# Patient Record
Sex: Female | Born: 1985 | Race: Black or African American | Hispanic: No | Marital: Single | Smoking: Never smoker
Health system: Southern US, Community
[De-identification: ages and names within clinical notes are randomized; demographics above are authoritative.]

---

## 2008-01-19 ENCOUNTER — Inpatient Hospital Stay (HOSPITAL_COMMUNITY): Admission: AD | Admit: 2008-01-19 | Discharge: 2008-01-22 | Payer: Self-pay | Admitting: Obstetrics

## 2009-12-04 ENCOUNTER — Inpatient Hospital Stay (HOSPITAL_COMMUNITY)
Admission: EM | Admit: 2009-12-04 | Discharge: 2009-12-11 | Payer: Self-pay | Source: Home / Self Care | Admitting: Family Medicine

## 2009-12-04 ENCOUNTER — Ambulatory Visit: Payer: Self-pay | Admitting: Emergency Medicine

## 2009-12-04 DIAGNOSIS — N12 Tubulo-interstitial nephritis, not specified as acute or chronic: Secondary | ICD-10-CM | POA: Insufficient documentation

## 2009-12-08 ENCOUNTER — Encounter (INDEPENDENT_AMBULATORY_CARE_PROVIDER_SITE_OTHER): Payer: Self-pay | Admitting: Internal Medicine

## 2009-12-08 ENCOUNTER — Ambulatory Visit: Payer: Self-pay | Admitting: Surgery

## 2009-12-29 ENCOUNTER — Ambulatory Visit: Payer: Self-pay | Admitting: Nurse Practitioner

## 2009-12-29 DIAGNOSIS — E119 Type 2 diabetes mellitus without complications: Secondary | ICD-10-CM

## 2009-12-29 LAB — CONVERTED CEMR LAB: Blood Glucose, Fingerstick: 371

## 2010-04-03 NOTE — Letter (Signed)
Summary: Handout Printed  Printed Handout:  - Pyelonephritis

## 2010-04-03 NOTE — Assessment & Plan Note (Signed)
Summary: NEW - Establish Care   Vital Signs:  Patient profile:   25 year old female Height:      69.75 inches Weight:      162.5 pounds BMI:     23.57 Temp:     97.3 degrees F oral Pulse rate:   89 / minute Pulse rhythm:   regular Resp:     16 per minute BP sitting:   120 / 84  (left arm) Cuff size:   regular  Vitals Entered By: Armenia Shannon (December 29, 2009 2:19 PM) CC: xf/u.... pt says she is not taking any meds right now since she is not able to afford it.... pt is not sure about her flu vaccine... Is Patient Diabetic? Yes Pain Assessment Patient in pain? no      CBG Result 371  Does patient need assistance? Functional Status Self care Ambulation Normal   CC:  xf/u.... pt says she is not taking any meds right now since she is not able to afford it.... pt is not sure about her flu vaccine....  History of Present Illness:  Pt into the office to establish care. No previous PCP Pt previously lived in [place], [place]  PMH - significant for diabetes since age 3 PMH - none  Hosptialized from 12/04/2009 to 12/11/2009 (full hospital D/C reviewed) Pt presented with a 3 days history of nausea and vomiting.  She also had abdominal pain, and fever.  Blood sugar was also noted to be over 600. Pt was admitted to step down, IV insulin, IVF.  Pt was also started on Rocephin. Tachycardia - started on lopressor (but pt has not started to take because she has not gotten the med refiled) She was dx with ARDS and dx with pneumonia.  Started on azithromycin.   Obesity - reports that she was once about 230lbs but she lost a lot of weight which helped some with her diabetes.  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 Stands for extended periods of time  Diabetes Management History:      The patient is a 25 years old female who comes in for evaluation of DM Type 2.  She has not been enrolled in the "Diabetic Education Program".  She states understanding of dietary principles and is  following her diet appropriately.  Self foot exams are not being performed.  She is not checking home blood sugars.  She says that she is not exercising regularly.        Hypoglycemic symptoms are not occurring.  No hyperglycemic symptoms are reported.  Other comments include: Pt completed her last dose of insulin last week.    Habits & Providers  Alcohol-Tobacco-Diet     Alcohol drinks/day: <1     Tobacco Status: never  Exercise-Depression-Behavior     Does Patient Exercise: no     Drug Use: never  Allergies (verified): No Known Drug Allergies  Family History: father - diabetes (deceased), MI maternal grandmother - diabetes mother - healthy maternal grandmother - healthy maternal grandfather - diabetes  Social History: 1 child (infant) tobacco - none ETOH - social  Drug - noneSmoking Status:  never Drug Use:  never Does Patient Exercise:  no  Review of Systems CV:  Denies chest pain or discomfort. Resp:  Denies cough. GI:  Denies abdominal pain, nausea, and vomiting. GU:  Denies dysuria. Endo:  Denies excessive thirst and excessive urination.  Physical Exam  General:  alert.   Head:  normocephalic.   Lungs:  normal breath sounds.   Heart:  normal rate and regular rhythm.   Abdomen:  normal bowel sounds.   Msk:  normal ROM.   Neurologic:  alert & oriented X3.     Impression & Recommendations:  Problem # 1:  DIABETES MELLITUS, TYPE II (ICD-250.00) BS elevated today but that is likely because she has been without her insulin new rx given today Her updated medication list for this problem includes:    Lantus 100 Unit/ml Soln (Insulin glargine) .Marland KitchenMarland KitchenMarland KitchenMarland Kitchen 30 units subcutaneously nightly    Novolog 100 Unit/ml Soln (Insulin aspart) .Marland Kitchen... Continue according to sliding scale  Orders: Capillary Blood Glucose/CBG (29562)  Problem # 2:  Hosp for PYELONEPHRITIS (ICD-590.80) handout given to pt about dx  Problem # 3:  NEED PROPHYLACTIC VACCINATION&INOCULATION FLU  (ICD-V04.81) given today in office  Complete Medication List: 1)  Lantus 100 Unit/ml Soln (Insulin glargine) .... 30 units subcutaneously nightly 2)  Novolog 100 Unit/ml Soln (Insulin aspart) .... Continue according to sliding scale  Other Orders: Flu Vaccine 73yrs + (13086) Admin 1st Vaccine (57846)  Patient Instructions: 1)  Get an appointment for eligibility to get an orange card to continue to be seen in this office. 2)  You have been given the flu vaccine today. 3)  You can get insulin from the [facility] pharmacy 4)  Follow up with n.martin,fnp for diabetes in 4 weeks for diabetes. 5)  Will need u/a, microalbumin, pneumovax, monofilament. 6)  will need glucometer  Prescriptions: NOVOLOG 100 UNIT/ML SOLN (INSULIN ASPART) Continue according to sliding scale  #1 x 1   Entered and Authorized by:   Lehman Prom FNP   Signed by:   Lehman Prom FNP on 12/29/2009   Method used:   Print then Give to Patient   RxID:   [id number] LANTUS 100 UNIT/ML SOLN (INSULIN GLARGINE) 30 units subcutaneously nightly  #1 month qs x 1   Entered and Authorized by:   Lehman Prom FNP   Signed by:   Lehman Prom FNP on 12/29/2009   Method used:   Print then Give to Patient   RxID:   [id number]    Orders Added: 1)  New Patient Level III [95638] 2)  Capillary Blood Glucose/CBG [82948] 3)  Flu Vaccine 58yrs + [75643] 4)  Admin 1st Vaccine [32951]   Immunizations Administered:  Influenza Vaccine # 1:    Vaccine Type: Fluvax 3+    Site: right deltoid    Mfr: GlaxoSmithKline    Dose: 0.5 ml    Route: IM    Given by: Armenia Shannon    Exp. Date: 09/01/2010    Lot #: [id number]    VIS given: 09/26/09 version given December 29, 2009.  Flu Vaccine Consent Questions:    Do you have a history of severe allergic reactions to this vaccine? no    Any prior history of allergic reactions to egg and/or gelatin? no    Do you have a sensitivity to the preservative Thimersol? no    Do  you have a past history of Guillan-Barre Syndrome? no    Do you currently have an acute febrile illness? no    Have you ever had a severe reaction to latex? no    Vaccine information given and explained to patient? yes    Are you currently pregnant? no   Immunizations Administered:  Influenza Vaccine # 1:    Vaccine Type: Fluvax 3+    Site: right deltoid    Mfr: GlaxoSmithKline    Dose:  0.5 ml    Route: IM    Given by: Armenia Shannon    Exp. Date: 09/01/2010    Lot #: [id number]    VIS given: 09/26/09 version given December 29, 2009.   CXR  Procedure date:  12/04/2009  Findings:      mild peribronchial thickening without focal airspace disease  CT of Abdomen  Procedure date:  12/04/2009  Findings:      low attenenuation lesion in both kidneys with mild perinephric stranding. The finding was consistent with bilateral pyelonephritis  X-ray  Procedure date:  12/04/2009  Findings:      abdomen - negative  MISC. Report  Procedure date:  12/04/2009  Findings:      CT angiogram - no evidence of pulmonary embolism.  Extensive airspace infiltrate bilaterally in the lower lobe and in the left upper lobe compatible with pneumonia   CXR  Procedure date:  12/04/2009  Findings:      mild peribronchial thickening without focal airspace disease  CT of Abdomen  Procedure date:  12/04/2009  Findings:      low attenenuation lesion in both kidneys with mild perinephric stranding. The finding was consistent with bilateral pyelonephritis  X-ray  Procedure date:  12/04/2009  Findings:      abdomen - negative  MISC. Report  Procedure date:  12/04/2009  Findings:      CT angiogram - no evidence of pulmonary embolism.  Extensive airspace infiltrate bilaterally in the lower lobe and in the left upper lobe compatible with pneumonia

## 2010-04-03 NOTE — Letter (Signed)
Summary: PT INFORMATION SHEET  PT INFORMATION SHEET   Imported By: Arta Bruce 01/18/2010 15:39:10  _____________________________________________________________________  External Attachment:    Type:   Image     Comment:   External Document

## 2010-05-17 LAB — GLUCOSE, CAPILLARY
Glucose-Capillary: 121 mg/dL — ABNORMAL HIGH (ref 70–99)
Glucose-Capillary: 125 mg/dL — ABNORMAL HIGH (ref 70–99)
Glucose-Capillary: 130 mg/dL — ABNORMAL HIGH (ref 70–99)
Glucose-Capillary: 137 mg/dL — ABNORMAL HIGH (ref 70–99)
Glucose-Capillary: 143 mg/dL — ABNORMAL HIGH (ref 70–99)
Glucose-Capillary: 149 mg/dL — ABNORMAL HIGH (ref 70–99)
Glucose-Capillary: 152 mg/dL — ABNORMAL HIGH (ref 70–99)
Glucose-Capillary: 152 mg/dL — ABNORMAL HIGH (ref 70–99)
Glucose-Capillary: 153 mg/dL — ABNORMAL HIGH (ref 70–99)
Glucose-Capillary: 154 mg/dL — ABNORMAL HIGH (ref 70–99)
Glucose-Capillary: 154 mg/dL — ABNORMAL HIGH (ref 70–99)
Glucose-Capillary: 154 mg/dL — ABNORMAL HIGH (ref 70–99)
Glucose-Capillary: 156 mg/dL — ABNORMAL HIGH (ref 70–99)
Glucose-Capillary: 159 mg/dL — ABNORMAL HIGH (ref 70–99)
Glucose-Capillary: 167 mg/dL — ABNORMAL HIGH (ref 70–99)
Glucose-Capillary: 168 mg/dL — ABNORMAL HIGH (ref 70–99)
Glucose-Capillary: 171 mg/dL — ABNORMAL HIGH (ref 70–99)
Glucose-Capillary: 176 mg/dL — ABNORMAL HIGH (ref 70–99)
Glucose-Capillary: 180 mg/dL — ABNORMAL HIGH (ref 70–99)
Glucose-Capillary: 181 mg/dL — ABNORMAL HIGH (ref 70–99)
Glucose-Capillary: 192 mg/dL — ABNORMAL HIGH (ref 70–99)
Glucose-Capillary: 200 mg/dL — ABNORMAL HIGH (ref 70–99)
Glucose-Capillary: 215 mg/dL — ABNORMAL HIGH (ref 70–99)
Glucose-Capillary: 216 mg/dL — ABNORMAL HIGH (ref 70–99)
Glucose-Capillary: 231 mg/dL — ABNORMAL HIGH (ref 70–99)
Glucose-Capillary: 232 mg/dL — ABNORMAL HIGH (ref 70–99)
Glucose-Capillary: 334 mg/dL — ABNORMAL HIGH (ref 70–99)
Glucose-Capillary: 343 mg/dL — ABNORMAL HIGH (ref 70–99)
Glucose-Capillary: 82 mg/dL (ref 70–99)
Glucose-Capillary: 98 mg/dL (ref 70–99)
Glucose-Capillary: 99 mg/dL (ref 70–99)
Glucose-Capillary: >600 mg/dL (ref 70–99)

## 2010-05-17 LAB — COMPREHENSIVE METABOLIC PANEL
ALT: 12 U/L (ref 0–35)
ALT: <8 U/L (ref 0–35)
ALT: <8 U/L (ref 0–35)
AST: 15 U/L (ref 0–37)
AST: 20 U/L (ref 0–37)
AST: 23 U/L (ref 0–37)
Alkaline Phosphatase: 93 U/L (ref 39–117)
BUN: 1 mg/dL — ABNORMAL LOW (ref 6–23)
BUN: 3 mg/dL — ABNORMAL LOW (ref 6–23)
BUN: 8 mg/dL (ref 6–23)
CO2: 22 mEq/L (ref 19–32)
CO2: 23 mEq/L (ref 19–32)
CO2: 27 mEq/L (ref 19–32)
CO2: 30 mEq/L (ref 19–32)
CO2: 31 mEq/L (ref 19–32)
Calcium: 7.8 mg/dL — ABNORMAL LOW (ref 8.4–10.5)
Calcium: 8.1 mg/dL — ABNORMAL LOW (ref 8.4–10.5)
Calcium: 8.6 mg/dL (ref 8.4–10.5)
Chloride: 105 mEq/L (ref 96–112)
Chloride: 110 mEq/L (ref 96–112)
Chloride: 97 mEq/L (ref 96–112)
Creatinine, Ser: 0.46 mg/dL (ref 0.4–1.2)
Creatinine, Ser: 0.51 mg/dL (ref 0.4–1.2)
Creatinine, Ser: 0.52 mg/dL (ref 0.4–1.2)
Creatinine, Ser: 0.54 mg/dL (ref 0.4–1.2)
Creatinine, Ser: 0.83 mg/dL (ref 0.4–1.2)
GFR calc Af Amer: >60 mL/min (ref >60)
GFR calc Af Amer: >60 mL/min (ref >60)
GFR calc non Af Amer: >60 mL/min (ref >60)
GFR calc non Af Amer: >60 mL/min (ref >60)
GFR calc non Af Amer: >60 mL/min (ref >60)
GFR calc non Af Amer: >60 mL/min (ref >60)
GFR calc non Af Amer: >60 mL/min (ref >60)
GFR calc non Af Amer: >60 mL/min (ref >60)
Glucose, Bld: 159 mg/dL — ABNORMAL HIGH (ref 70–99)
Glucose, Bld: 188 mg/dL — ABNORMAL HIGH (ref 70–99)
Glucose, Bld: 509 mg/dL — ABNORMAL HIGH (ref 70–99)
Glucose, Bld: 88 mg/dL (ref 70–99)
Potassium: 3.3 mEq/L — ABNORMAL LOW (ref 3.5–5.1)
Sodium: 136 mEq/L (ref 135–145)
Sodium: 138 mEq/L (ref 135–145)
Sodium: 138 mEq/L (ref 135–145)
Total Bilirubin: 0.4 mg/dL (ref 0.3–1.2)
Total Bilirubin: 0.9 mg/dL (ref 0.3–1.2)
Total Bilirubin: 1.2 mg/dL (ref 0.3–1.2)
Total Protein: 5.9 g/dL — ABNORMAL LOW (ref 6.0–8.3)

## 2010-05-17 LAB — BLOOD GAS, ARTERIAL
Acid-Base Excess: 0.2 mmol/L (ref 0.0–2.0)
Bicarbonate: 23.7 mEq/L (ref 20.0–24.0)
O2 Saturation: 96.8 %
Patient temperature: 99.5
TCO2: 21.8 mmol/L (ref 0–100)
pO2, Arterial: 82 mmHg (ref 80.0–100.0)

## 2010-05-17 LAB — DIFFERENTIAL
Basophils Absolute: 0 10*3/uL (ref 0.0–0.1)
Basophils Absolute: 0 10*3/uL (ref 0.0–0.1)
Basophils Absolute: 0 10*3/uL (ref 0.0–0.1)
Basophils Absolute: 0 10*3/uL (ref 0.0–0.1)
Basophils Relative: 0 % (ref 0–1)
Basophils Relative: 0 % (ref 0–1)
Eosinophils Absolute: 0 10*3/uL (ref 0.0–0.7)
Eosinophils Absolute: 0.2 10*3/uL (ref 0.0–0.7)
Eosinophils Absolute: 0.2 10*3/uL (ref 0.0–0.7)
Eosinophils Relative: 0 % (ref 0–5)
Eosinophils Relative: 1 % (ref 0–5)
Eosinophils Relative: 2 % (ref 0–5)
Eosinophils Relative: 2 % (ref 0–5)
Lymphocytes Relative: 13 % (ref 12–46)
Lymphocytes Relative: 22 % (ref 12–46)
Lymphocytes Relative: 23 % (ref 12–46)
Lymphocytes Relative: 5 % — ABNORMAL LOW (ref 12–46)
Lymphocytes Relative: 8 % — ABNORMAL LOW (ref 12–46)
Lymphs Abs: 1.2 K/uL (ref 0.7–4.0)
Lymphs Abs: 2.1 10*3/uL (ref 0.7–4.0)
Lymphs Abs: 2.5 10*3/uL (ref 0.7–4.0)
Lymphs Abs: 2.6 10*3/uL (ref 0.7–4.0)
Monocytes Absolute: 1.2 10*3/uL — ABNORMAL HIGH (ref 0.1–1.0)
Monocytes Relative: 4 % (ref 3–12)
Monocytes Relative: 4 % (ref 3–12)
Monocytes Relative: 5 % (ref 3–12)
Monocytes Relative: 7 % (ref 3–12)
Neutro Abs: 12.3 10*3/uL — ABNORMAL HIGH (ref 1.7–7.7)
Neutro Abs: 20.8 K/uL — ABNORMAL HIGH (ref 1.7–7.7)
Neutro Abs: 7.9 10*3/uL — ABNORMAL HIGH (ref 1.7–7.7)
Neutrophils Relative %: 70 % (ref 43–77)
Neutrophils Relative %: 79 % — ABNORMAL HIGH (ref 43–77)
Neutrophils Relative %: 84 % — ABNORMAL HIGH (ref 43–77)
Neutrophils Relative %: 88 % — ABNORMAL HIGH (ref 43–77)
Neutrophils Relative %: 90 % — ABNORMAL HIGH (ref 43–77)

## 2010-05-17 LAB — CBC
HCT: 27.4 % — ABNORMAL LOW (ref 36.0–46.0)
HCT: 27.7 % — ABNORMAL LOW (ref 36.0–46.0)
HCT: 28.5 % — ABNORMAL LOW (ref 36.0–46.0)
HCT: 29.3 % — ABNORMAL LOW (ref 36.0–46.0)
HCT: 30.4 % — ABNORMAL LOW (ref 36.0–46.0)
HCT: 37.1 % (ref 36.0–46.0)
Hemoglobin: 10.3 g/dL — ABNORMAL LOW (ref 12.0–15.0)
Hemoglobin: 12.7 g/dL (ref 12.0–15.0)
Hemoglobin: 9.2 g/dL — ABNORMAL LOW (ref 12.0–15.0)
Hemoglobin: 9.3 g/dL — ABNORMAL LOW (ref 12.0–15.0)
Hemoglobin: 9.3 g/dL — ABNORMAL LOW (ref 12.0–15.0)
MCH: 27.8 pg (ref 26.0–34.0)
MCH: 27.8 pg (ref 26.0–34.0)
MCH: 28.1 pg (ref 26.0–34.0)
MCH: 28.4 pg (ref 26.0–34.0)
MCH: 28.6 pg (ref 26.0–34.0)
MCHC: 33.2 g/dL (ref 30.0–36.0)
MCHC: 33.3 g/dL (ref 30.0–36.0)
MCHC: 33.5 g/dL (ref 30.0–36.0)
MCHC: 33.7 g/dL (ref 30.0–36.0)
MCHC: 34.2 g/dL (ref 30.0–36.0)
MCV: 83.6 fL (ref 78.0–100.0)
MCV: 83.8 fL (ref 78.0–100.0)
Platelets: 209 10*3/uL (ref 150–400)
Platelets: 246 K/uL (ref 150–400)
Platelets: 354 10*3/uL (ref 150–400)
Platelets: 414 10*3/uL — ABNORMAL HIGH (ref 150–400)
RBC: 3.28 MIL/uL — ABNORMAL LOW (ref 3.87–5.11)
RBC: 3.3 MIL/uL — ABNORMAL LOW (ref 3.87–5.11)
RBC: 3.34 MIL/uL — ABNORMAL LOW (ref 3.87–5.11)
RBC: 3.6 MIL/uL — ABNORMAL LOW (ref 3.87–5.11)
RBC: 4.43 MIL/uL (ref 3.87–5.11)
RDW: 14.4 % (ref 11.5–15.5)
RDW: 14.4 % (ref 11.5–15.5)
RDW: 14.6 % (ref 11.5–15.5)
WBC: 13.1 10*3/uL — ABNORMAL HIGH (ref 4.0–10.5)
WBC: 18 10*3/uL — ABNORMAL HIGH (ref 4.0–10.5)
WBC: 23.1 10*3/uL — ABNORMAL HIGH (ref 4.0–10.5)
WBC: 23.2 K/uL — ABNORMAL HIGH (ref 4.0–10.5)

## 2010-05-17 LAB — COMPREHENSIVE METABOLIC PANEL WITH GFR
ALT: 8 U/L (ref 0–35)
AST: 17 U/L (ref 0–37)
Albumin: 3.6 g/dL (ref 3.5–5.2)
Alkaline Phosphatase: 119 U/L — ABNORMAL HIGH (ref 39–117)
Calcium: 8.4 mg/dL (ref 8.4–10.5)
GFR calc Af Amer: >60 mL/min (ref >60)
Potassium: 3.7 meq/L (ref 3.5–5.1)
Sodium: 134 meq/L — ABNORMAL LOW (ref 135–145)
Total Protein: 8 g/dL (ref 6.0–8.3)

## 2010-05-17 LAB — URINE MICROSCOPIC-ADD ON

## 2010-05-17 LAB — CULTURE, BLOOD (ROUTINE X 2)
Culture  Setup Time: 201110032128
Culture  Setup Time: 201110032130
Culture: NO GROWTH
Culture: NO GROWTH

## 2010-05-17 LAB — BASIC METABOLIC PANEL
BUN: 6 mg/dL (ref 6–23)
CO2: 26 mEq/L (ref 19–32)
CO2: 29 mEq/L (ref 19–32)
Calcium: 7.6 mg/dL — ABNORMAL LOW (ref 8.4–10.5)
Calcium: 8.7 mg/dL (ref 8.4–10.5)
Chloride: 108 mEq/L (ref 96–112)
Creatinine, Ser: 0.48 mg/dL (ref 0.4–1.2)
Creatinine, Ser: 0.62 mg/dL (ref 0.4–1.2)
Creatinine, Ser: 0.66 mg/dL (ref 0.4–1.2)
GFR calc Af Amer: >60 mL/min (ref >60)
GFR calc Af Amer: >60 mL/min (ref >60)
GFR calc non Af Amer: >60 mL/min (ref >60)
GFR calc non Af Amer: >60 mL/min (ref >60)
GFR calc non Af Amer: >60 mL/min (ref >60)
GFR calc non Af Amer: >60 mL/min (ref >60)
Glucose, Bld: 170 mg/dL — ABNORMAL HIGH (ref 70–99)
Potassium: 3.8 mEq/L (ref 3.5–5.1)
Potassium: 4 mEq/L (ref 3.5–5.1)
Sodium: 137 mEq/L (ref 135–145)
Sodium: 140 mEq/L (ref 135–145)

## 2010-05-17 LAB — MAGNESIUM
Magnesium: 1.2 mg/dL — ABNORMAL LOW (ref 1.5–2.5)
Magnesium: 1.6 mg/dL (ref 1.5–2.5)
Magnesium: 1.7 mg/dL (ref 1.5–2.5)
Magnesium: 1.7 mg/dL (ref 1.5–2.5)
Magnesium: 1.7 mg/dL (ref 1.5–2.5)

## 2010-05-17 LAB — BRAIN NATRIURETIC PEPTIDE: Pro B Natriuretic peptide (BNP): 81.3 pg/mL (ref 0.0–100.0)

## 2010-05-17 LAB — URINALYSIS, ROUTINE W REFLEX MICROSCOPIC
Bilirubin Urine: NEGATIVE
Glucose, UA: >1000 mg/dL — AB
Ketones, ur: NEGATIVE mg/dL
Nitrite: NEGATIVE
Protein, ur: NEGATIVE mg/dL
Specific Gravity, Urine: 1.023 (ref 1.005–1.030)
Urobilinogen, UA: 1 mg/dL (ref 0.0–1.0)
pH: 6 (ref 5.0–8.0)

## 2010-05-17 LAB — POCT I-STAT, CHEM 8
Calcium, Ion: 0.93 mmol/L — ABNORMAL LOW (ref 1.12–1.32)
Chloride: 98 mEq/L (ref 96–112)
HCT: 44 % (ref 36.0–46.0)
Potassium: 3.7 mEq/L (ref 3.5–5.1)
Sodium: 133 mEq/L — ABNORMAL LOW (ref 135–145)

## 2010-05-17 LAB — URINE CULTURE: Colony Count: >=100000

## 2010-05-17 LAB — HEMOGLOBIN A1C: Mean Plasma Glucose: 315 mg/dL — ABNORMAL HIGH (ref <117)

## 2010-05-17 LAB — PREGNANCY, URINE: Preg Test, Ur: NEGATIVE

## 2010-05-17 LAB — HEPATIC FUNCTION PANEL
Albumin: 2 g/dL — ABNORMAL LOW (ref 3.5–5.2)
Total Protein: 5.2 g/dL — ABNORMAL LOW (ref 6.0–8.3)

## 2010-05-17 LAB — SEDIMENTATION RATE: Sed Rate: 105 mm/hr — ABNORMAL HIGH (ref 0–22)

## 2010-05-17 LAB — PHOSPHORUS: Phosphorus: 2.2 mg/dL — ABNORMAL LOW (ref 2.3–4.6)

## 2010-11-26 ENCOUNTER — Emergency Department (HOSPITAL_COMMUNITY)
Admission: EM | Admit: 2010-11-26 | Discharge: 2010-11-26 | Disposition: A | Discharge status: Home / Self Care | Payer: Self-pay | Attending: Emergency Medicine | Admitting: Emergency Medicine

## 2010-11-26 DIAGNOSIS — E119 Type 2 diabetes mellitus without complications: Secondary | ICD-10-CM | POA: Insufficient documentation

## 2010-11-26 DIAGNOSIS — K089 Disorder of teeth and supporting structures, unspecified: Secondary | ICD-10-CM | POA: Insufficient documentation

## 2010-11-26 LAB — BASIC METABOLIC PANEL
CO2: 28 mEq/L (ref 19–32)
Calcium: 9.1 mg/dL (ref 8.4–10.5)
Chloride: 97 mEq/L (ref 96–112)
Glucose, Bld: 476 mg/dL — ABNORMAL HIGH (ref 70–99)
Sodium: 133 mEq/L — ABNORMAL LOW (ref 135–145)

## 2010-11-26 LAB — GLUCOSE, CAPILLARY: Glucose-Capillary: 279 mg/dL — ABNORMAL HIGH (ref 70–99)

## 2010-12-05 LAB — CBC
HCT: 28.7 — ABNORMAL LOW
HCT: 30.3 — ABNORMAL LOW
Hemoglobin: 9.5 — ABNORMAL LOW
MCHC: 33.2
MCHC: 33.2
MCV: 86.7
Platelets: 198
Platelets: 217
RBC: 3.32 — ABNORMAL LOW
RBC: 3.49 — ABNORMAL LOW
WBC: 12.1 — ABNORMAL HIGH
WBC: 14.1 — ABNORMAL HIGH

## 2010-12-05 LAB — URIC ACID: Uric Acid, Serum: 4.4

## 2010-12-05 LAB — COMPREHENSIVE METABOLIC PANEL
BUN: 4 — ABNORMAL LOW
CO2: 24
Chloride: 98
Creatinine, Ser: 0.48
GFR calc non Af Amer: >60
Total Bilirubin: 0.5

## 2011-01-08 ENCOUNTER — Encounter: Payer: Self-pay | Admitting: Internal Medicine

## 2011-05-16 ENCOUNTER — Encounter (HOSPITAL_COMMUNITY): Payer: Self-pay

## 2011-05-16 ENCOUNTER — Emergency Department (HOSPITAL_COMMUNITY)
Admission: EM | Admit: 2011-05-16 | Discharge: 2011-05-16 | Disposition: A | Discharge status: Home / Self Care | Payer: Medicaid Other | Attending: Emergency Medicine | Admitting: Emergency Medicine

## 2011-05-16 DIAGNOSIS — R5381 Other malaise: Secondary | ICD-10-CM | POA: Insufficient documentation

## 2011-05-16 DIAGNOSIS — E109 Type 1 diabetes mellitus without complications: Secondary | ICD-10-CM | POA: Insufficient documentation

## 2011-05-16 LAB — URINALYSIS, ROUTINE W REFLEX MICROSCOPIC
Leukocytes, UA: NEGATIVE
Nitrite: NEGATIVE
Specific Gravity, Urine: 1.034 — ABNORMAL HIGH (ref 1.005–1.030)
Urobilinogen, UA: 0.2 mg/dL (ref 0.0–1.0)

## 2011-05-16 LAB — BASIC METABOLIC PANEL
BUN: 7 mg/dL (ref 6–23)
GFR calc Af Amer: >90 mL/min (ref >90)
GFR calc non Af Amer: >90 mL/min (ref >90)
Potassium: 3.9 mEq/L (ref 3.5–5.1)
Sodium: 129 mEq/L — ABNORMAL LOW (ref 135–145)

## 2011-05-16 LAB — CBC
Hemoglobin: 12.4 g/dL (ref 12.0–15.0)
MCHC: 34.1 g/dL (ref 30.0–36.0)
RBC: 4.62 MIL/uL (ref 3.87–5.11)

## 2011-05-16 LAB — PREGNANCY, URINE: Preg Test, Ur: NEGATIVE

## 2011-05-16 LAB — URINE MICROSCOPIC-ADD ON

## 2011-05-16 LAB — GLUCOSE, CAPILLARY: Glucose-Capillary: 465 mg/dL — ABNORMAL HIGH (ref 70–99)

## 2011-05-16 MED ORDER — SODIUM CHLORIDE 0.9 % IV BOLUS (SEPSIS)
1000.0000 mL | Freq: Once | INTRAVENOUS | Status: AC
  Administered 2011-05-16: 1000 mL via INTRAVENOUS

## 2011-05-16 MED ORDER — FREESTYLE SYSTEM KIT: 1.0000 | PACK | Status: DC | PRN

## 2011-05-16 MED ORDER — INSULIN REGULAR HUMAN 100 UNIT/ML IJ SOLN: 10.0000 [IU] | Freq: Once | INTRAMUSCULAR | Status: DC

## 2011-05-16 MED ORDER — INSULIN REGULAR HUMAN 100 UNIT/ML IJ SOLN: 2.0000 [IU] | Freq: Once | INTRAMUSCULAR | Status: DC

## 2011-05-16 MED ORDER — INSULIN ASPART 100 UNIT/ML ~~LOC~~ SOLN
10.0000 [IU] | Freq: Once | SUBCUTANEOUS | Status: AC
  Administered 2011-05-16: 10 [IU] via INTRAVENOUS
  Filled 2011-05-16: qty 1

## 2011-05-16 MED ORDER — FREESTYLE SYSTEM KIT: 1.0000 | PACK | Status: AC | PRN

## 2011-05-16 MED ORDER — INSULIN ASPART 100 UNIT/ML ~~LOC~~ SOLN: 2.0000 [IU] | Freq: Once | SUBCUTANEOUS | Status: DC

## 2011-05-16 NOTE — Progress Notes (Signed)
Pt confirms change in pcp from [facility] to [facility] Entered in [place]

## 2011-05-16 NOTE — ED Notes (Signed)
Accepted patient from offgoing nurse found her alert and oriented skin and drry No CO pain noted

## 2011-05-16 NOTE — ED Provider Notes (Signed)
History     CSN: [id number]  Arrival date & time 05/16/11  1630   First MD Initiated Contact with Patient 05/16/11 1707      Chief Complaint  Patient presents with  . Hyperglycemia    (Consider location/radiation/quality/duration/timing/severity/associated sxs/prior treatment) HPI Comments: Patient here after referral from PCP - she reports that she just got insurance back and had not been taking her insulin for over a year - states usually had taken regular on a sliding scale - states she went to her PCP today because she was feeling weak - admits to polydipsia, polyphagia and weakness, no blurred vision, chest pain, shortness of breath, abdominal pain, nausea or vomiting.  No prior history of DKA  Patient is a 26 y.o. female presenting with diabetes problem. The history is provided by the patient. No language interpreter was used.  Diabetes She presents for her follow-up diabetic visit. She has type 1 diabetes mellitus. No MedicAlert identification noted. The initial diagnosis of diabetes was made 10 years ago. Her disease course has been fluctuating. There are no hypoglycemic associated symptoms. Associated symptoms include fatigue, polydipsia, polyphagia, polyuria and weakness. Pertinent negatives for diabetes include no blurred vision, no chest pain, no foot paresthesias, no foot ulcerations, no visual change and no weight loss. There are no hypoglycemic complications. Symptoms are worsening. Pertinent negatives for diabetic complications include no autonomic neuropathy, CVA, heart disease, impotence, nephropathy, peripheral neuropathy, PVD or retinopathy. Risk factors for coronary artery disease include no known risk factors. When asked about current treatments, none were reported. She is compliant with treatment none of the time. She is following a generally unhealthy diet. When asked about meal planning, she reported none. She has not had a previous visit with a dietician. She participates  in exercise three times a week. An ACE inhibitor/angiotensin II receptor blocker is not being taken. She does not see a podiatrist.Eye exam is not current.    Past Medical History  Diagnosis Date  . Diabetes mellitus     No past surgical history on file.  No family history on file.  History  Substance Use Topics  . Smoking status: Not on file  . Smokeless tobacco: Not on file  . Alcohol Use: Yes    OB History    Grav Para Term Preterm Abortions TAB SAB Ect Mult Living                  Review of Systems  Constitutional: Positive for fatigue. Negative for weight loss.  Eyes: Negative for blurred vision.  Cardiovascular: Negative for chest pain.  Genitourinary: Positive for polyuria. Negative for impotence.  Neurological: Positive for weakness.  Hematological: Positive for polydipsia and polyphagia.  All other systems reviewed and are negative.    Allergies  Review of patient's allergies indicates no known allergies.  Home Medications   Current Outpatient Rx  Name Route Sig Dispense Refill  . IBUPROFEN 200 MG PO TABS Oral Take 400 mg by mouth every 6 (six) hours as needed. Pain.      BP 112/77  Pulse 85  Temp(Src) 98.5 F (36.9 C) (Oral)  Resp 16  SpO2 100%  LMP 05/15/2011  Physical Exam  Nursing note and vitals reviewed. Constitutional: She is oriented to person, place, and time. She appears well-developed and well-nourished. No distress.  HENT:  Head: Normocephalic and atraumatic.  Right Ear: External ear normal.  Left Ear: External ear normal.  Nose: Nose normal.  Mouth/Throat: Oropharynx is clear and moist. No oropharyngeal  exudate.  Eyes: Conjunctivae are normal. Pupils are equal, round, and reactive to light. No scleral icterus.  Neck: Normal range of motion. Neck supple.  Cardiovascular: Normal rate, regular rhythm and normal heart sounds.  Exam reveals no gallop and no friction rub.   No murmur heard. Pulmonary/Chest: Effort normal and breath  sounds normal. No respiratory distress. She exhibits no tenderness.  Abdominal: Soft. Bowel sounds are normal. She exhibits no distension. There is no tenderness.  Musculoskeletal: Normal range of motion. She exhibits no edema and no tenderness.  Lymphadenopathy:    She has no cervical adenopathy.  Neurological: She is alert and oriented to person, place, and time. No cranial nerve deficit.  Skin: Skin is warm and dry. No rash noted. No erythema. No pallor.  Psychiatric: She has a normal mood and affect. Her behavior is normal. Judgment and thought content normal.    ED Course  Procedures (including critical care time)  Labs Reviewed  URINALYSIS, ROUTINE W REFLEX MICROSCOPIC - Abnormal; Notable for the following:    Specific Gravity, Urine 1.034 (*)    Glucose, UA >1000 (*)    Hgb urine dipstick LARGE (*)    All other components within normal limits  BASIC METABOLIC PANEL - Abnormal; Notable for the following:    Sodium 129 (*)    Chloride 92 (*)    Glucose, Bld 511 (*)    Creatinine, Ser 0.46 (*)    All other components within normal limits  GLUCOSE, CAPILLARY - Abnormal; Notable for the following:    Glucose-Capillary 465 (*)    All other components within normal limits  PREGNANCY, URINE  CBC  URINE MICROSCOPIC-ADD ON   No results found.  Results for orders placed during the hospital encounter of 05/16/11  PREGNANCY, URINE      Component Value Range   Preg Test, Ur NEGATIVE  NEGATIVE   URINALYSIS, ROUTINE W REFLEX MICROSCOPIC      Component Value Range   Color, Urine YELLOW  YELLOW    APPearance CLEAR  CLEAR    Specific Gravity, Urine 1.034 (*) 1.005 - 1.030    pH 6.0  5.0 - 8.0    Glucose, UA >1000 (*) NEGATIVE (mg/dL)   Hgb urine dipstick LARGE (*) NEGATIVE    Bilirubin Urine NEGATIVE  NEGATIVE    Ketones, ur NEGATIVE  NEGATIVE (mg/dL)   Protein, ur NEGATIVE  NEGATIVE (mg/dL)   Urobilinogen, UA 0.2  0.0 - 1.0 (mg/dL)   Nitrite NEGATIVE  NEGATIVE    Leukocytes,  UA NEGATIVE  NEGATIVE   BASIC METABOLIC PANEL      Component Value Range   Sodium 129 (*) 135 - 145 (mEq/L)   Potassium 3.9  3.5 - 5.1 (mEq/L)   Chloride 92 (*) 96 - 112 (mEq/L)   CO2 28  19 - 32 (mEq/L)   Glucose, Bld 511 (*) 70 - 99 (mg/dL)   BUN 7  6 - 23 (mg/dL)   Creatinine, Ser 8.29 (*) 0.50 - 1.10 (mg/dL)   Calcium 9.5  8.4 - 56.2 (mg/dL)   GFR calc non Af Amer >90  >90 (mL/min)   GFR calc Af Amer >90  >90 (mL/min)  CBC      Component Value Range   WBC 9.6  4.0 - 10.5 (K/uL)   RBC 4.62  3.87 - 5.11 (MIL/uL)   Hemoglobin 12.4  12.0 - 15.0 (g/dL)   HCT 13.0  86.5 - 78.4 (%)   MCV 78.8  78.0 - 100.0 (fL)  MCH 26.8  26.0 - 34.0 (pg)   MCHC 34.1  30.0 - 36.0 (g/dL)   RDW 04.5  40.9 - 81.1 (%)   Platelets 291  150 - 400 (K/uL)  GLUCOSE, CAPILLARY      Component Value Range   Glucose-Capillary 465 (*) 70 - 99 (mg/dL)  URINE MICROSCOPIC-ADD ON      Component Value Range   Squamous Epithelial / LPF RARE  RARE    WBC, UA 0-2  <3 (WBC/hpf)   RBC / HPF 0-2  <3 (RBC/hpf)   Bacteria, UA RARE  RARE   GLUCOSE, CAPILLARY      Component Value Range   Glucose-Capillary 367 (*) 70 - 99 (mg/dL)   Comment 1 Notify RN    GLUCOSE, CAPILLARY      Component Value Range   Glucose-Capillary 275 (*) 70 - 99 (mg/dL)   No results found.   Hyperglycemia without DKA    MDM  Patient is not acidotic - plan to get blood sugar down below 250 and then send  Home on regular insulin via sliding scale and she can follow up with PCP - she also has no glucometer.    Patient will be discharged, was quite labile with blood sugar here and I do not feel we need to dose the lantus and will allow Dr. Bruna Potter to do so.  I will write for sliding scale for the next several days along with glucometer and test strips.  Patient is in agreement with this plan and we will discharge her home.    Izola Price [place], [place] 05/16/11 2051

## 2011-05-16 NOTE — ED Notes (Signed)
Pt states hasn't checked her sugar or taken any of her DM meds for over a year. States went to PCP d/t feeling weak.

## 2011-05-16 NOTE — Discharge Instructions (Signed)
Correction Insulin Your caregiver has decided you need insulin at home. You have been given a correctional scale (sliding scale) in case you need extra insulin when your blood sugar is too high (hyperglycemia). The following instructions will assist you in how to use that correctional scale.  WHAT IS A CORRECTIONAL SCALE (SLIDING SCALE)?  When you check your blood sugar, sometimes it will be higher than your caregiver wants it to be. You may need an extra dose of insulin to bring your blood sugar to your desired level (also known as your goal, target level, or normal level.) The correctional scale is prescribed by your caregiver based on your specific needs. Your correctional scale has 2 parts. The first shows you a blood sugar range. The second part tells you how much extra insulin to give yourself if your blood sugar falls within this range. You will not need an extra dose of insulin if your blood glucose is in the desired range. You should always give yourself the normal amount of insulin your caregiver ordered as well.  The most common time to check your blood sugar is before eating and at bedtime. Check with your caregiver so he or she can tell you what the best times are for you.  WHY IS IT IMPORTANT TO KEEP YOUR BLOOD SUGAR LEVELS AT YOUR DESIRED LEVEL?  It helps to prevent long-term complications of diabetes, such as eye disease, kidney failure, and other serious complications. WHAT TYPE OF INSULIN WILL YOU USE?  To help bring down blood sugars that are too high, your caregiver has prescribed a short-acting or a rapid-acting insulin. An example of a short-acting insulin would be Regular. Remember, you may also have a longer-acting insulin.  WHAT DO I NEED TO DO?   Check your blood sugar with your home blood glucose meter as recommended by your caregiver.   Using your correctional scale, find the range your blood sugar lies in.   Look for the units of insulin that matches the  blood sugar range. Give yourself the dose of correctional insulin your caregiver has prescribed. Always make sure you are using the right type of insulin.   Prior to the injection make sure you have food available that you can eat in the next 15 to 30 minutes.   If your correctional insulin is rapid acting, start eating your meal within 15 minutes after you have given yourself the insulin injection. If you wait longer than 15 minutes to eat, your blood sugar might get too low.   If your correctional insulin is short acting (Regular), start eating your meal within 30 minutes after you have given yourself the insulin injection. If you wait longer than 30 minutes to eat, your blood sugar might get too low. Symptoms of low blood sugar (hypoglycemia) may include feeling shaky or weak, sweating a lot, not thinking straight, difficulty seeing, agitation, or crankiness. Check your blood sugar immediately and treat your results as directed by your caregiver.   Keep a log of your blood sugar results with the time you took the test and the amount of insulin that you injected. This information will help your caregiver manage your medications.   Note on your log anything that may affect your blood sugars such as:   Changes in normal exercise or activity.   Changes in your normal schedule, such as staying up late, going on vacation, changing your diet, or holidays.   New medications. This includes all medications. Some medications, even those that  do not require a prescription, may cause high blood sugars.   Illness or stress.   Changes in when you actually took your medication.   Changes in your meals, such as skipping a meal, a late meal, or dining out.   Eating things that may affect blood glucose, such as snacks, larger meal portions than normal, or drinks with sugar.   Ask your caregiver any questions you have.  WHY DO I NEED A CORRECTIONAL SCALE (SLIDING SCALE) IF I HAVE NEVER BEEN DIAGNOSED  WITH DIABETES?   Keeping your blood glucose in range is important for your overall health.   You may have been prescribed medications that cause your blood glucose to be higher than normal.   Your discharging caregiver can provide additional information as needed.  WHEN SHOULD I SEEK MEDICAL CARE?  If you have experienced hypoglycemia that you are unable to treat with your usual routine.   You have questions about your care.   You have persistent hypoglycemia or hyperglycemia.  Someone who lives with you should seek emergency medical care if you become unresponsive. MAKE SURE YOU:  Understand these instructions.   Will watch your condition.   Will get help right away if you are not doing well or get worse.  Document Released: 07/12/2010 Document Revised: 02/07/2011 Document Reviewed: 07/12/2010 [facility] Patient Information 2012 [place], [place].Blood Sugar Monitoring, Adult GLUCOSE METERS FOR SELF-MONITORING OF BLOOD GLUCOSE  It is important to be able to correctly measure your blood sugar (glucose). You can use a blood glucose monitor (a small battery-operated device) to check your glucose level at any time. This allows you and your caregiver to monitor your diabetes and to determine how well your treatment plan is working. The process of monitoring your blood glucose with a glucose meter is called self-monitoring of blood glucose (SMBG). When people with diabetes control their blood sugar, they have better health. To test for glucose with a typical glucose meter, place the disposable strip in the meter. Then place a small sample of blood on the "test strip." The test strip is coated with chemicals that combine with glucose in blood. The meter measures how much glucose is present. The meter displays the glucose level as a number. Several new models can record and store a number of test results. Some models can connect to personal computers to store test results or print them out.    Newer meters are often easier to use than older models. Some meters allow you to get blood from places other than your fingertip. Some new models have automatic timing, error codes, signals, or barcode readers to help with proper adjustment (calibration). Some meters have a large display screen or spoken instructions for people with visual impairments.  INSTRUCTIONS FOR USING GLUCOSE METERS Wash your hands with soap and warm water, or clean the area with alcohol. Dry your hands completely.  Prick the side of your fingertip with a lancet (a sharp-pointed tool used by hand).  Hold the hand down and gently milk the finger until a small drop of blood appears. Catch the blood with the test strip.  Follow the instructions for inserting the test strip and using the SMBG meter. Most meters require the meter to be turned on and the test strip to be inserted before applying the blood sample.  Record the test result.  Read the instructions carefully for both the meter and the test strips that go with it. Meter instructions are found in the user manual. Keep this  manual to help you solve any problems that may arise. Many meters use "error codes" when there is a problem with the meter, the test strip, or the blood sample on the strip. You will need the manual to understand these error codes and fix the problem.  New devices are available such as laser lancets and meters that can test blood taken from "alternative sites" of the body, other than fingertips. However, you should use standard fingertip testing if your glucose changes rapidly. Also, use standard testing if:  You have eaten, exercised, or taken insulin in the past 2 hours.  You think your glucose is low.  You tend to not feel symptoms of low blood glucose (hypoglycemia).  You are ill or under stress.  Clean the meter as directed by the manufacturer.  Test the meter for accuracy as directed by the manufacturer.  Take your meter with you to your  caregiver's office. This way, you can test your glucose in front of your caregiver to make sure you are using the meter correctly. Your caregiver can also take a sample of blood to test using a routine lab method. If values on the glucose meter are close to the lab results, you and your caregiver will see that your meter is working well and you are using good technique. Your caregiver will advise you about what to do if the results do not match.  FREQUENCY OF TESTING  Your caregiver will tell you how often you should check your blood glucose. This will depend on your type of diabetes, your current level of diabetes control, and your types of medicines. The following are general guidelines, but your care plan may be different. Record all your readings and the time of day you took them for review with your caregiver.  Diabetes type 1.  When you are using insulin with good diabetic control (either multiple daily injections or via a pump), you should check your glucose 4 times a day.  If your diabetes is not well controlled, you may need to monitor more frequently, including before meals and 2 hours after meals, at bedtime, and occasionally between 2 a.m. and 3 a.m.  You should always check your glucose before a dose of insulin or before changing the rate on your insulin pump.  Diabetes type 2.  Guidelines for SMBG in diabetes type 2 are not as well defined.  If you are on insulin, follow the guidelines above.  If you are on medicines, but not insulin, and your glucose is not well controlled, you should test at least twice daily.  If you are not on insulin, and your diabetes is controlled with medicines or diet alone, you should test at least once daily, usually before breakfast.  A weekly profile will help your caregiver advise you on your care plan. The week before your visit, check your glucose before a meal and 2 hours after a meal at least daily. You may want to test before and after a different meal  each day so you and your caregiver can tell how well controlled your blood sugars are throughout the course of a 24 hour period.  Gestational diabetes (diabetes during pregnancy).  Frequent testing is often necessary. Accurate timing is important.  If you are not on insulin, check your glucose 4 times a day. Check it before breakfast and 1 hour after the start of each meal.  If you are on insulin, check your glucose 6 times a day. Check it before each meal  and 1 hour after the first bite of each meal.  General guidelines.  More frequent testing is required at the start of insulin treatment. Your caregiver will instruct you.  Test your glucose any time you suspect you have low blood sugar (hypoglycemia).  You should test more often when you change medicines, when you have unusual stress or illness, or in other unusual circumstances.  OTHER THINGS TO KNOW ABOUT GLUCOSE METERS Measurement Range. Most glucose meters are able to read glucose levels over a broad range of values from as low as 0 to as high as 600 mg/dL. If you get an extremely high or low reading from your meter, you should first confirm it with another reading. Report very high or very low readings to your caregiver.  Whole Blood Glucose versus Plasma Glucose. Some older home glucose meters measure glucose in your whole blood. In a lab or when using some newer home glucose meters, the glucose is measured in your plasma (one component of blood). The difference can be important. It is important for you and your caregiver to know whether your meter gives its results as "whole blood equivalent" or "plasma equivalent."  Display of High and Low Glucose Values. Part of learning how to operate a meter is understanding what the meter results mean. Know how high and low glucose concentrations are displayed on your meter.  Factors that Affect Glucose Meter Performance. The accuracy of your test results depends on many factors and varies depending on the  brand and type of meter. These factors include:  Low red blood cell count (anemia).  Substances in your blood (such as uric acid, vitamin C, and others).  Environmental factors (temperature, humidity, altitude).  Name-brand versus generic test strips.  Calibration. Make sure your meter is set up properly. It is a good idea to do a calibration test with a control solution recommended by the manufacturer of your meter whenever you begin using a fresh bottle of test strips. This will help verify the accuracy of your meter.  Improperly stored, expired, or defective test strips. Keep your strips in a dry place with the lid on.  Soiled meter.  Inadequate blood sample.  NEW TECHNOLOGIES FOR GLUCOSE TESTING Alternative site testing Some glucose meters allow testing blood from alternative sites. These include the: Upper arm.  Forearm.  Base of the thumb.  Thigh.  Sampling blood from alternative sites may be desirable. However, it may have some limitations. Blood in the fingertips show changes in glucose levels more quickly than blood in other parts of the body. This means that alternative site test results may be different from fingertip test results, not because of the meter's ability to test accurately, but because the actual glucose concentration can be different.  Continuous Glucose Monitoring Devices to measure your blood glucose continuously are available, and others are in development. These methods can be more expensive than self-monitoring with a glucose meter. However, it is uncertain how effective and reliable these devices are. Your caregiver will advise you if this approach makes sense for you. IF BLOOD SUGARS ARE CONTROLLED, PEOPLE WITH DIABETES REMAIN HEALTHIER.  SMBG is an important part of the treatment plan of patients with diabetes mellitus. Below are reasons for using SMBG:  It confirms that your glucose is at a specific, healthy level.  It detects hypoglycemia and severe  hyperglycemia.  It allows you and your caregiver to make adjustments in response to changes in lifestyle for individuals requiring medicine.  It determines the  need for starting insulin therapy in temporary diabetes that happens during pregnancy (gestational diabetes).  Document Released: 02/21/2003 Document Revised: 02/07/2011 Document Reviewed: 06/14/2010 [facility] Patient Information 2012 [place], [place].Daily Diabetes Record Week of _____________________________ Date: _________  Ann Berry, BG/Medications: ________________ / __________________________________________________________   LUNCH, BG/Medications: ____________________ / __________________________________________________________   Ann Berry, BG/Medications: ___________________ / __________________________________________________________   BEDTIME, BG/Medications: __________________ / __________________________________________________________  Date: _________  Ann Berry, BG/Medications: ________________ / __________________________________________________________   LUNCH, BG/Medications: ____________________ / __________________________________________________________   Ann Berry, BG/Medications: ___________________ / __________________________________________________________   BEDTIME, BG/Medications: __________________ / __________________________________________________________  Date: _________  Ann Berry, BG/Medications: ________________ / __________________________________________________________   LUNCH, BG/Medications: ____________________ / __________________________________________________________   Ann Berry, BG/Medications: ___________________ / __________________________________________________________   BEDTIME, BG/Medications: __________________ / __________________________________________________________  Date: _________  Ann Berry, BG/Medications: ________________ /  __________________________________________________________   LUNCH, BG/Medications: ____________________ / __________________________________________________________   Ann Berry, BG/Medications: ___________________ / __________________________________________________________   BEDTIME, BG/Medications: __________________ / __________________________________________________________  Date: _________  Ann Berry, BG/Medications: ________________ / __________________________________________________________   LUNCH, BG/Medications: ____________________ / __________________________________________________________   Ann Berry, BG/Medications: ___________________ / __________________________________________________________   BEDTIME, BG/Medications: __________________ / __________________________________________________________  Date: _________  Ann Berry, BG/Medications: ________________ / __________________________________________________________   LUNCH, BG/Medications: ____________________ / __________________________________________________________   Ann Berry, BG/Medications: ___________________ / __________________________________________________________   BEDTIME, BG/Medications: __________________ / __________________________________________________________  Date: _________  Ann Berry, BG/Medications: ________________ / __________________________________________________________   LUNCH, BG/Medications: ____________________ / __________________________________________________________   Ann Berry, BG/Medications: ___________________ / __________________________________________________________   BEDTIME, BG/Medications: __________________ / __________________________________________________________  Notes: __________________________________________________________________________________________________ Document Released: 01/23/2004 Document Revised: 02/07/2011 Document Reviewed:  11/16/2008 [employer] Patient Information 2012 [employer].Insulin Treatment in Diabetes Diabetes is a lasting (chronic) disease. It occurs when the body does not properly use the sugar (glucose) that is released from food. Glucose levels are controlled by a hormone called insulin, which is made by your pancreas. Depending on the type of diabetes you have, either:  The pancreas does not make any insulin (type 1 diabetes).   The pancreas makes too little insulin, and the body cannot respond normally to the insulin that is made (type 2 diabetes).  Without insulin, death can occur. Diabetes requires lifelong monitoring and treatment. This document will discuss the role of insulin in your treatment and provide information about insulin.  LIFESTYLE CHOICES Lifestyle choices can affect both the control of your diabetes and your risk of complications from diabetes. Lifestyle choices are critically important in the overall management of diabetes. They are important even if you do not need to take insulin.  Eat a healthy diet. Ask for help if you need it.   Exercise regularly. Ask for help if you need it.   Diet and exercise can help:   Reduce the amount of insulin you need.   Your body to use your insulin more effectively.   Reduce your risk of high blood pressure (or reduce your blood pressure, if it is high).   Reduce your cholesterol level.   Reduce your weight.  Healthy lifestyle choices play an important role in controlling cardiovascular disease (heart attack, stroke, vascular disease, and others), which is a primary complication of diabetes. For optimal control of diabetes, you must reduce your cardiovascular risks and manage your blood sugar. MEDICATIONS BESIDES INSULIN  Your caregiver may recommend medicines for you in addition to insulin. This will depend on 3 things:   Your diabetes type.   How well insulin alone meets your treatment goals.   Other health factors.  There are  different types of medicines that help treat diabetes. The goal is to control your blood sugar  the best way possible, which will reduce your risk of complications. Adding other medicines may also reduce the amount of insulin you need.  INSULIN  People with type 1 diabetes must take insulin to stay alive. Their body does not produce it. People with type 2 diabetes might require insulin in addition to, or instead of, other medicines. In either case, proper use of insulin is critical to control your diabetes.  There are a number of different types of insulin. Usually, you will give yourself injections, though others can be trained to give them to you. Some people have an insulin pump that delivers insulin continuously through a soft, flexible tube (canula) that is placedunder the skin of the abdomen.Other sites including the hips, thighs, or upper arms may also be used.  Using insulin requires that you check your blood sugar several times a day. The exact number of times and time of day to check your blood sugar will vary depending on your type of diabetes, your type of insulin, and treatment goals. Your caregiver will direct you.  Generally, different insulins have different properties. The following is a general guide. Specifics will vary by product, and new products are introduced periodically.   Short-acting insulin starts working quickly (in as little as 5 minutes) and wears off in 3 to 6 hours (sometimes longer). This type of insulin works well when taken before a meal to bring your blood sugar quickly back to normal. There are several different types of short-acting insulin. Some work quickly and others last longer in your system.   Intermediate-acting insulin starts working in 2 hours and wears off after about 10 to18 hours. This insulin will lower your blood sugar for a longer period of time, but will not be as effective in lowering your blood sugar right after a meal.   Long-acting insulin  mimics the small amount of insulin that your pancreas usually produces throughout the day. You need to have some insulin present at all times, as it is crucial to the metabolism of brain cells and other cells. Long-acting insulin is meant to be used either once or twice a day. It is usually used in combination with other types of insulin, or in combination with other diabetes medicines.  Discuss the type of insulin you are taking with your caregiver or pharmacist. You will then be aware of when the insulin can be expected to peak and when it will wear off.  Your caregiver will usually have a strategy in mind when treating you with insulin. This will vary with your type of diabetes, your diabetes treatment goals, and your health history. It is important that you understand something about this strategy so you may be a partner in treating your diabetes. Here are some terms you might hear:  Basal insulin. You need to have a small amount of insulin present in your blood at all times. Sometimes oral medicines will be enough. For other people, and especially for people with type 1 diabetes, insulin is needed. Usually, intermediate-acting or long-acting insulin is used once or twice a day to accomplish this.   Prandial (meal-related) insulin. Your blood sugar will rise rapidly after a meal. Short-acting insulin can be used right before the meal to bring your blood sugar back to normal quickly. You might be instructed to adjust the amount of insulin depending on how much carbohydrate (starch) is in your meal.   Corrective insulin. You might be instructed to check your blood sugar at certain times of  the day. You then might use a small amount of short-acting insulin to bring the blood sugar down to normal if it is elevated.   Tight control (also called intensive therapy). Tight control is keeping your blood sugar as close to your target as possible and keeping it from going too high after meals. People with tight  control of their diabetes may have fewer long-term complications from their diabetes.   Glycohemoglobin (also called glyco, glycosylated hemoglobin, Hemoglobin A1c, or A1c) level. This measures how well your blood sugar has been controlled during the past 1 to 3 months. It helps your caregiver see how effective your treatment is and decide if any changes are needed. Your caregiver will discuss your target glycohemoglobin with you.  Insulin treatment requires your careful attention. Treatment plans will be different for different people. Some people do well with a simple program. Others require more complicated programs with multiple insulin injections daily. You will work with your caregiver to develop the best program for you. Regardless of your insulin treatment plan, you must also do your best on weight control, diet and food choices, exercise, blood pressure control, and cholesterol control.  BENEFITS OF DIABETES TREATMENT  Research shows that optimal treatment of your diabetes will reduce the risk of kidney, eye, and nerve complications. If you have type 1 diabetes, your risk of heart and vascular disease also decreases with good diabetes control. The better you control your blood sugars (and the lower your glycohemoglobin), the lower your risk of complications. RISKS OF INSULIN Although insulin treatment is important, it does have some risks. Insulin treatment may be complex, but it is critical for maintaining your good health. Frequent follow-up visits with your caregiver, at least early on, are usually needed.  Insulin can cause your blood sugar to go too low (hypoglycemia). This can be a dangerous complication that must be quickly recognized and treated.   Weight gain can occur.   Improper injection technique can cause hypoglycemia, skin injury or irritation, or other problems. You must learn to inject insulin properly.   Other medicines used for diabetes can have other complications. Discuss  your medicines and their complications with your caregiver.  GOALS OF DIABETES CARE  The goal of treating your diabetes is to allow you to live as long as you can with as few complications as possible. Several factors help you work towards this goal:  You should achieve a blood sugar as close to normal as possible without causing hypoglycemia. Generally, this means a glycohemoglobin of between 6% and 7%.   You should achieve and maintain an ideal body weight.   You should exercise regularly.   Your blood pressure should be under 130/80.   Your cholesterol should be controlled, with your LDL under 100. Your target may be different depending on your health factors.   You should not smoke.   You should be up to date on immunizations, including influenza and pneumonia.   You should be monitored for eye, kidney, heart, and vascular health regularly. Preventative medicines are sometimes used for these conditions.  Your specific goals may vary, depending on your health factors. You should discuss issues with your caregiver.  HOME CARE INSTRUCTIONS   Do not smoke.   Eat a healthy diet as instructed. Ask for help if you need it.   Exercise regularly. Ask for help if you need it.   Stay up to date on your immunizations.   See your caregiver on a regular basis.  Follow your diabetes care plan carefully. Take medicines and insulin as directed. Check your blood sugar as directed, and keep track of it in a log. Understand your glycohemoglobin and other diabetes goals. Understand how to detect and treat hypoglycemia.   Follow your care plan for blood pressure and elevated cholesterol if you have these problems.   Do your blood tests as directed. This is important and helps monitor your diabetes.   Check your feet every night for sores or wounds.   See your eye doctor once a year.   If you have an illness that causes loss of appetite, vomiting, or diarrhea, you should speak with your  caregiver about temporary changes in your insulin doses and other medicines.  SEEK MEDICAL CARE IF:  You are having problems keeping your blood sugar at target range.   You are having episodes of hypoglycemia.   You are having side effects from your medicines.   You have symptoms of an illness that are not improving after 3 to 4 days.   You have a sore or wound that is not healing.   You notice a change in vision or a new problem with your vision.   You develop a fever of more than 100.5 F (38.1 C).  SEEK IMMEDIATE MEDICAL CARE IF:   Your blood sugar goes below 70, especially if you have confusion, lightheadedness, or other symptoms.   Your blood sugar is very high (as advised by your caregiver) twice in a row.   An unexplained oral temperature above 102 F (38.9 C) develops.   You pass out.   You have chest pain or trouble breathing.   You have a sudden, severe headache.   You have sudden weakness in one arm or leg.   You have sudden difficulty speaking or swallowing.   You develop vomiting or diarrhea that is getting worse or not improving after 1 day.  Document Released: 05/17/2008 Document Revised: 02/07/2011 Document Reviewed: 06/14/2010 [facility] Patient Information 2012 [place], [place].   You should use the sliding scale for insulin injection for your blood sugars until you can be seen by Dr. Bruna Potter. Sugars 200-250                                    2 unit of regular SQ 250-300                                     4 units of regular SQ 300-350                                     6 units of regular SQ 350-400                                     8 units of regular SQ >400                                          10 units of regular SQ and call your PCP

## 2011-05-16 NOTE — ED Notes (Signed)
cbg [id number] 

## 2011-05-17 NOTE — ED Provider Notes (Signed)
Medical screening examination/treatment/procedure(s) were performed by non-physician practitioner and as supervising physician I was immediately available for consultation/collaboration.   Lyanne Co, MD 05/17/11 1320

## 2012-07-18 IMAGING — CT CT ANGIO CHEST
2 of 6 series · 19 of 36 positions shown · IV contrast (APPLIED)
Comparison: None
Correlation:  Chest radiograph 12/04/2009

CLINICAL DATA: Chest pain, cough, shortness of breath, fever,
question pulmonary embolism, history UTI, diabetic ketoacidosis,
pyelonephritis

CT ANGIOGRAPHY CHEST WITH CONTRAST
TECHNIQUE: Multidetector CT imaging of the chest was performed
using the standard protocol during bolus administration of
intravenous contrast.  Multiplanar CT image reconstructions
including MIPs were obtained to evaluate the vascular anatomy.
Breast shield utilized.  Sagittal and coronal MPR images
reconstructed from axial data set.
Contrast:  80 ml Rmnipaque-ZJJ IV

[Series 6: pe thins @ 1mm · axial · 0.64mm/px · z∈[-188,+32]mm · 18 of 246 slices shown]
[im 13/246  lung]
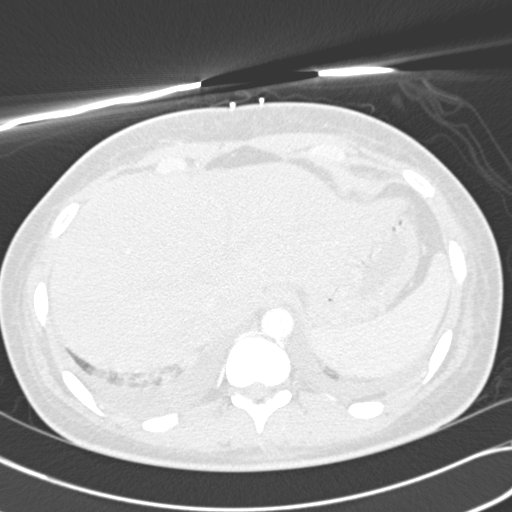
[im 25/246  mediastinal]
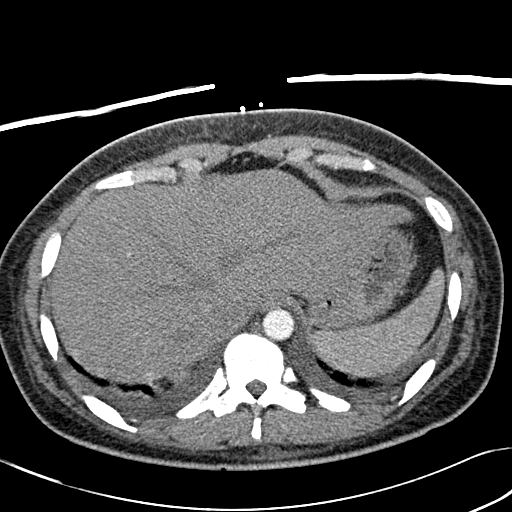
[im 37/246  lung]
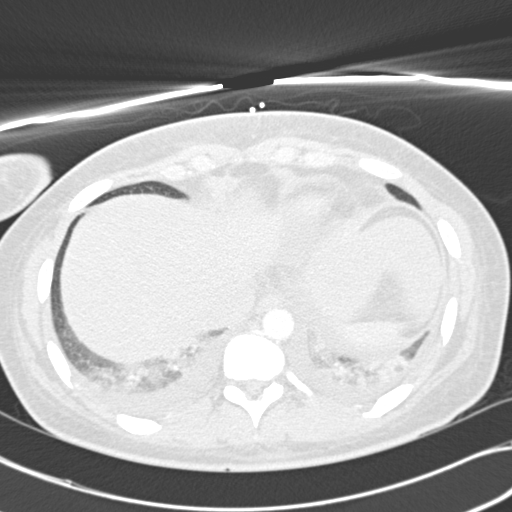
[im 50/246  mediastinal]
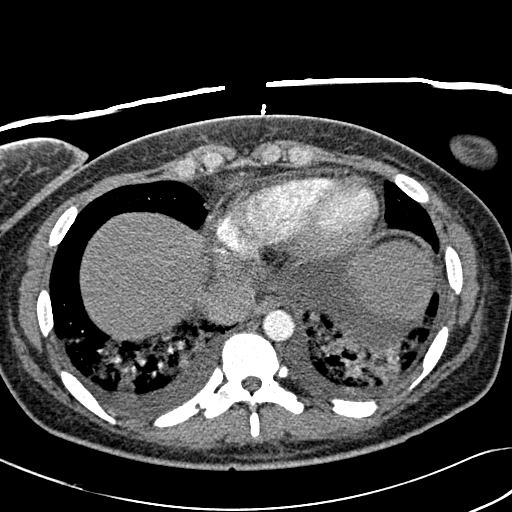
[im 62/246  lung]
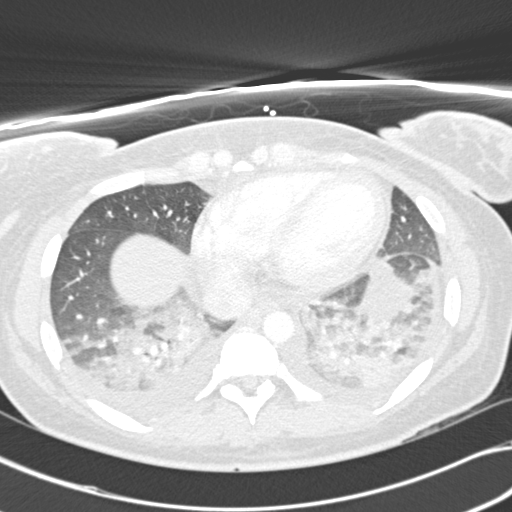
[im 74/246  mediastinal]
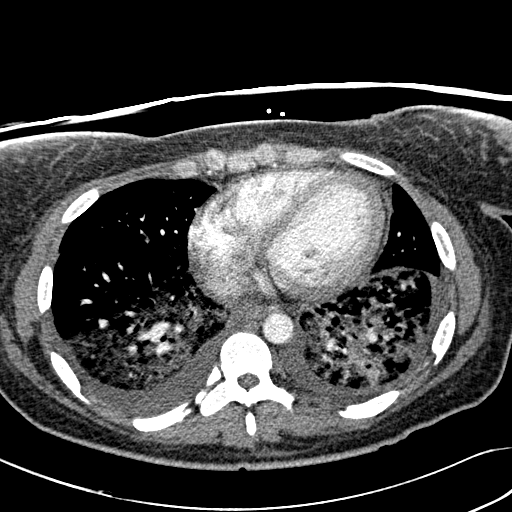
[im 86/246  lung]
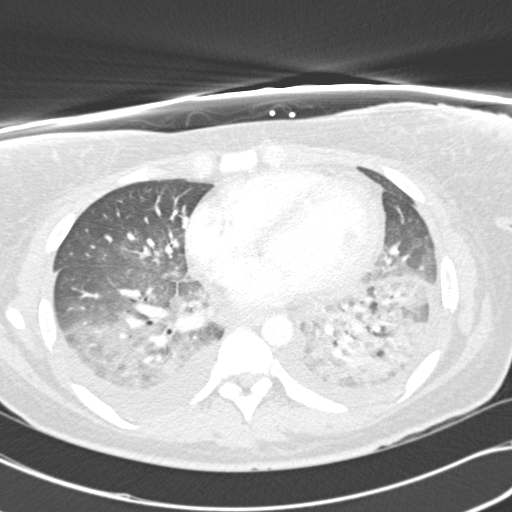
[im 99/246  mediastinal]
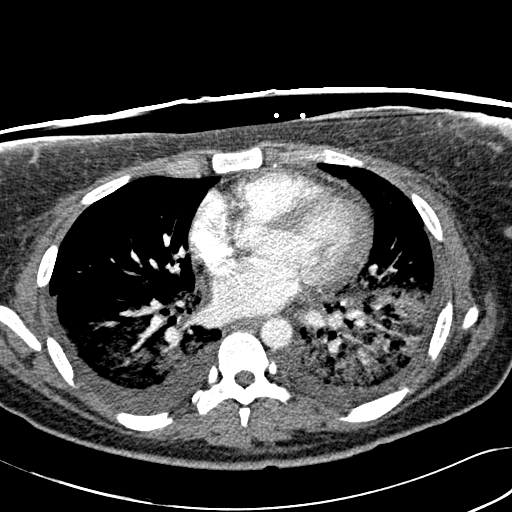
[im 111/246  lung]
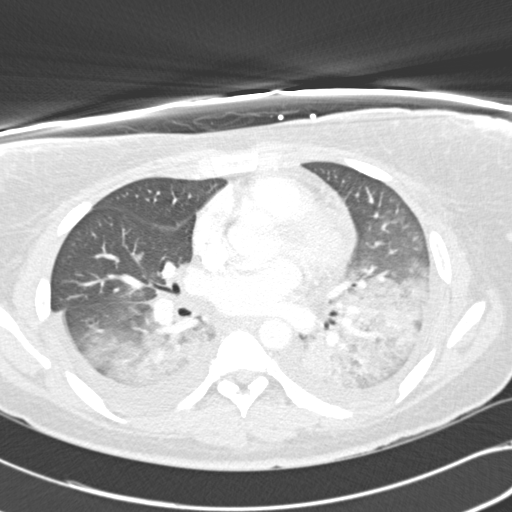
[im 135/246  mediastinal]
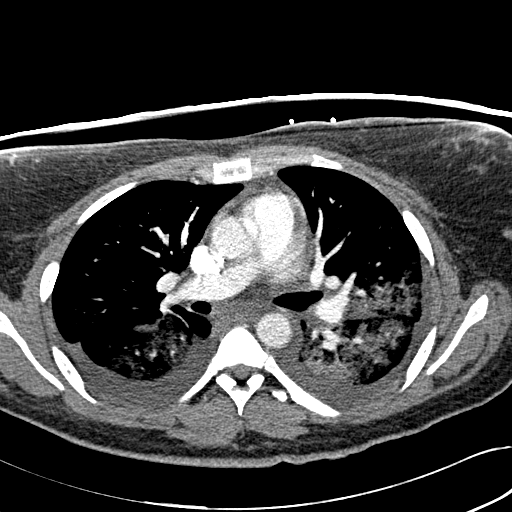
[im 148/246  lung]
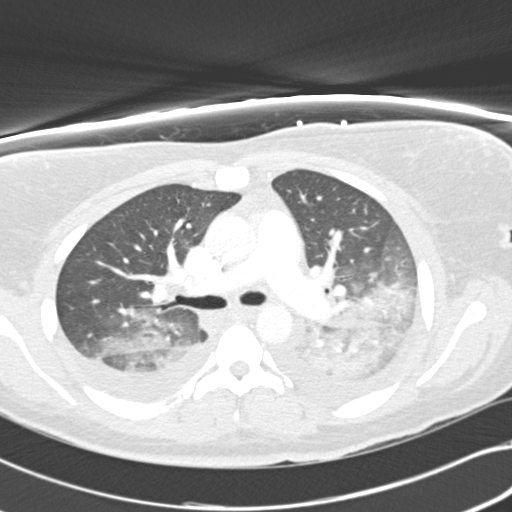
[im 160/246  mediastinal]
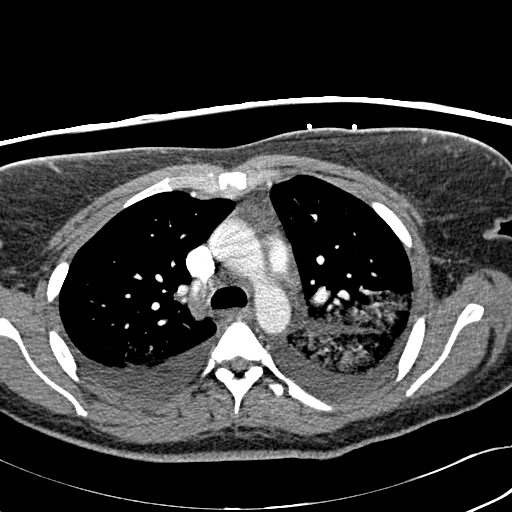
[im 172/246  lung]
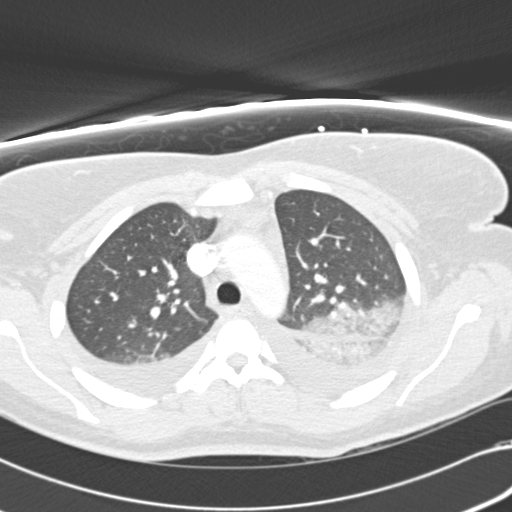
[im 184/246  mediastinal]
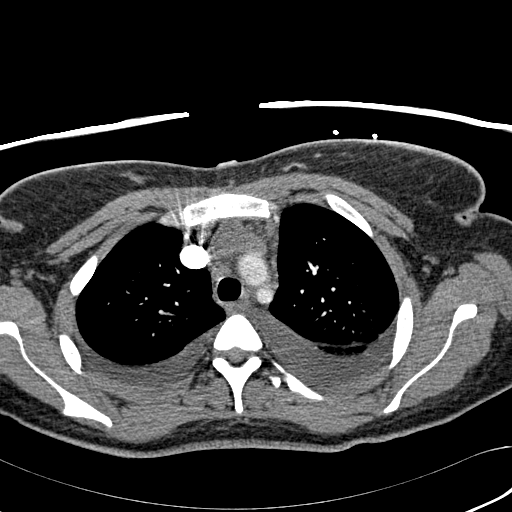
[im 197/246  lung]
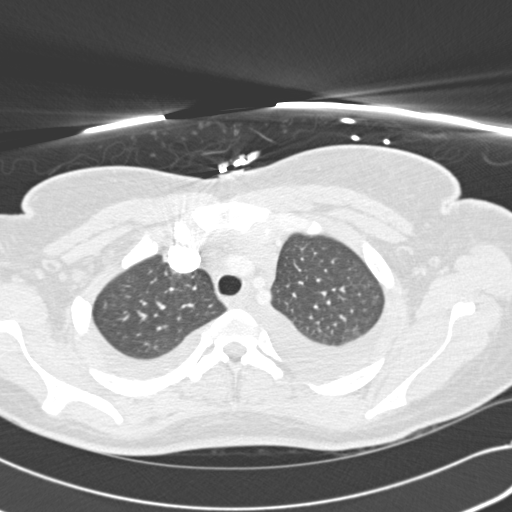
[im 209/246  mediastinal]
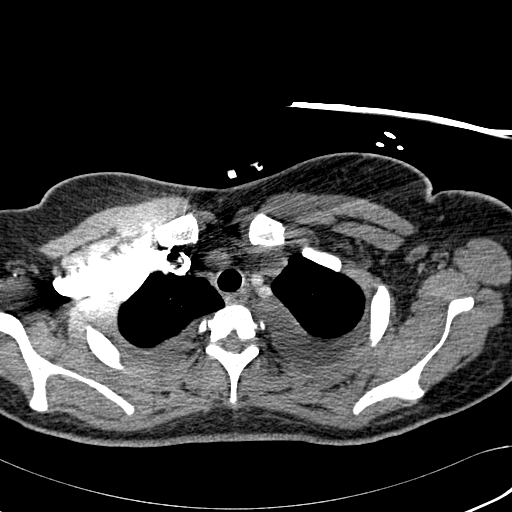
[im 221/246  lung]
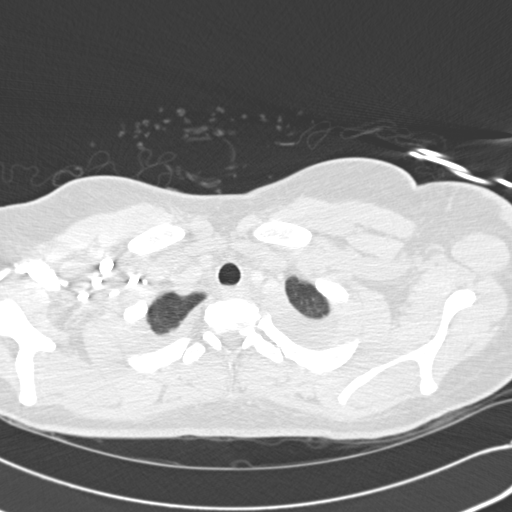
[im 233/246  mediastinal]
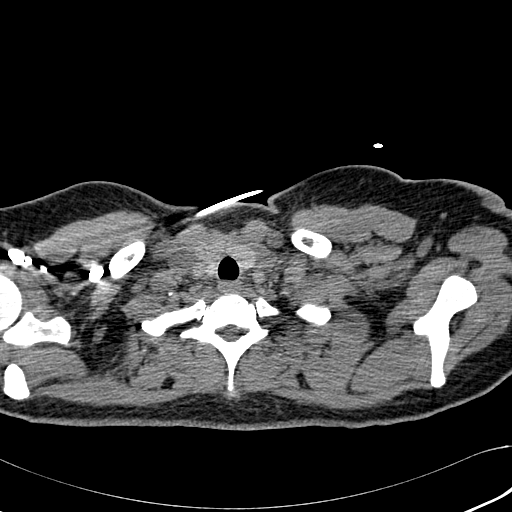

[Series 602: <mpr thick range> · coronal · 0.64mm/px · 1 of 128 slices shown]
[im 64/128  mediastinal]
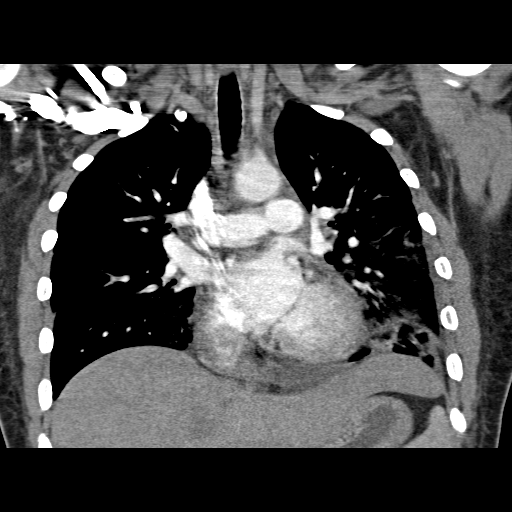

[19 of 36 positions shown; findings below may reference images not displayed]

FINDINGS: Aorta normal caliber without aneurysm or dissection.
Visualized portion of liver and spleen unremarkable.
Minimal pericardial fluid.
Small bilateral pleural effusions.
Pulmonary arteries patent.
No evidence pulmonary embolism.
No thoracic adenopathy.
Residual thymic tissue anterior mediastinum.
Extensive airspace infiltrates bilateral lower lobes, less left
upper lobe, compatible with pneumonia.
No pneumothorax or osseous abnormality.

Review of the MIP images confirms the above findings.
IMPRESSION: No evidence of pulmonary embolism.
Extensive airspace infiltrates bilateral lower lobes and in left
upper lobe compatible with pneumonia.
Bilateral small parapneumonic effusions.

## 2012-09-10 ENCOUNTER — Other Ambulatory Visit: Payer: Self-pay

## 2013-12-20 ENCOUNTER — Inpatient Hospital Stay (HOSPITAL_COMMUNITY)
Admission: AD | Admit: 2013-12-20 | Discharge: 2013-12-20 | Discharge status: Against Medical Advice | Payer: Medicaid Other | Attending: Family Medicine | Admitting: Family Medicine

## 2018-04-01 ENCOUNTER — Other Ambulatory Visit: Payer: Self-pay

## 2018-04-01 ENCOUNTER — Inpatient Hospital Stay (HOSPITAL_BASED_OUTPATIENT_CLINIC_OR_DEPARTMENT_OTHER)
Admission: EM | Admit: 2018-04-01 | Discharge: 2018-04-03 | DRG: 760 | Disposition: A | Discharge status: Home / Self Care | Payer: Medicaid Other | Attending: Internal Medicine | Admitting: Internal Medicine

## 2018-04-01 ENCOUNTER — Encounter (HOSPITAL_BASED_OUTPATIENT_CLINIC_OR_DEPARTMENT_OTHER): Payer: Self-pay | Admitting: *Deleted

## 2018-04-01 DIAGNOSIS — D62 Acute posthemorrhagic anemia: Secondary | ICD-10-CM | POA: Diagnosis not present

## 2018-04-01 DIAGNOSIS — E1165 Type 2 diabetes mellitus with hyperglycemia: Secondary | ICD-10-CM

## 2018-04-01 DIAGNOSIS — I959 Hypotension, unspecified: Secondary | ICD-10-CM | POA: Diagnosis not present

## 2018-04-01 DIAGNOSIS — N939 Abnormal uterine and vaginal bleeding, unspecified: Secondary | ICD-10-CM | POA: Diagnosis present

## 2018-04-01 DIAGNOSIS — R338 Other retention of urine: Secondary | ICD-10-CM | POA: Diagnosis present

## 2018-04-01 DIAGNOSIS — Z794 Long term (current) use of insulin: Secondary | ICD-10-CM | POA: Diagnosis not present

## 2018-04-01 DIAGNOSIS — R4182 Altered mental status, unspecified: Secondary | ICD-10-CM | POA: Diagnosis not present

## 2018-04-01 DIAGNOSIS — Z7289 Other problems related to lifestyle: Secondary | ICD-10-CM

## 2018-04-01 DIAGNOSIS — E1065 Type 1 diabetes mellitus with hyperglycemia: Secondary | ICD-10-CM | POA: Diagnosis not present

## 2018-04-01 DIAGNOSIS — IMO0002 Reserved for concepts with insufficient information to code with codable children: Secondary | ICD-10-CM

## 2018-04-01 DIAGNOSIS — N920 Excessive and frequent menstruation with regular cycle: Secondary | ICD-10-CM | POA: Diagnosis not present

## 2018-04-01 DIAGNOSIS — E118 Type 2 diabetes mellitus with unspecified complications: Secondary | ICD-10-CM

## 2018-04-01 DIAGNOSIS — D508 Other iron deficiency anemias: Secondary | ICD-10-CM | POA: Diagnosis not present

## 2018-04-01 DIAGNOSIS — D649 Anemia, unspecified: Secondary | ICD-10-CM

## 2018-04-01 DIAGNOSIS — E871 Hypo-osmolality and hyponatremia: Secondary | ICD-10-CM | POA: Diagnosis present

## 2018-04-01 DIAGNOSIS — T383X6A Underdosing of insulin and oral hypoglycemic [antidiabetic] drugs, initial encounter: Secondary | ICD-10-CM | POA: Diagnosis present

## 2018-04-01 DIAGNOSIS — R739 Hyperglycemia, unspecified: Secondary | ICD-10-CM

## 2018-04-01 LAB — CBC WITH DIFFERENTIAL/PLATELET
Abs Immature Granulocytes: 0.03 10*3/uL (ref 0.00–0.07)
BASOS PCT: 0 %
Basophils Absolute: 0 10*3/uL (ref 0.0–0.1)
EOS ABS: 0.1 10*3/uL (ref 0.0–0.5)
EOS PCT: 1 %
HCT: 18.6 % — ABNORMAL LOW (ref 36.0–46.0)
HEMOGLOBIN: 5.8 g/dL — AB (ref 12.0–15.0)
Immature Granulocytes: 0 %
Lymphocytes Relative: 32 %
Lymphs Abs: 2.7 10*3/uL (ref 0.7–4.0)
MCH: 23.6 pg — AB (ref 26.0–34.0)
MCHC: 31.2 g/dL (ref 30.0–36.0)
MCV: 75.6 fL — ABNORMAL LOW (ref 80.0–100.0)
MONO ABS: 0.4 10*3/uL (ref 0.1–1.0)
Monocytes Relative: 5 %
Neutro Abs: 5 10*3/uL (ref 1.7–7.7)
Neutrophils Relative %: 62 %
PLATELETS: 396 10*3/uL (ref 150–400)
RBC: 2.46 MIL/uL — AB (ref 3.87–5.11)
RDW: 15.6 % — AB (ref 11.5–15.5)
WBC: 8.2 10*3/uL (ref 4.0–10.5)
nRBC: 0 % (ref 0.0–0.2)

## 2018-04-01 LAB — BASIC METABOLIC PANEL
Anion gap: 8 (ref 5–15)
BUN: 13 mg/dL (ref 6–20)
CHLORIDE: 91 mmol/L — AB (ref 98–111)
CO2: 27 mmol/L (ref 22–32)
CREATININE: 0.96 mg/dL (ref 0.44–1.00)
Calcium: 8.2 mg/dL — ABNORMAL LOW (ref 8.9–10.3)
GFR calc Af Amer: >60 mL/min (ref >60)
GFR calc non Af Amer: >60 mL/min (ref >60)
Glucose, Bld: 733 mg/dL (ref 70–99)
Potassium: 3.7 mmol/L (ref 3.5–5.1)
Sodium: 126 mmol/L — ABNORMAL LOW (ref 135–145)

## 2018-04-01 LAB — POCT I-STAT EG7
Acid-Base Excess: 4 mmol/L — ABNORMAL HIGH (ref 0.0–2.0)
Bicarbonate: 30 mmol/L — ABNORMAL HIGH (ref 20.0–28.0)
Calcium, Ion: 1.22 mmol/L (ref 1.15–1.40)
HEMATOCRIT: 19 % — AB (ref 36.0–46.0)
Hemoglobin: 6.5 g/dL — CL (ref 12.0–15.0)
O2 SAT: 30 %
PCO2 VEN: 51.3 mmHg (ref 44.0–60.0)
POTASSIUM: 3.9 mmol/L (ref 3.5–5.1)
Patient temperature: 98.1
Sodium: 128 mmol/L — ABNORMAL LOW (ref 135–145)
TCO2: 32 mmol/L (ref 22–32)
pH, Ven: 7.373 (ref 7.250–7.430)
pO2, Ven: 20 mmHg — CL (ref 32.0–45.0)

## 2018-04-01 LAB — GLUCOSE, CAPILLARY: Glucose-Capillary: 465 mg/dL — ABNORMAL HIGH (ref 70–99)

## 2018-04-01 LAB — PREGNANCY, URINE: PREG TEST UR: NEGATIVE

## 2018-04-01 LAB — CBG MONITORING, ED
Glucose-Capillary: >600 mg/dL (ref 70–99)
Glucose-Capillary: 488 mg/dL — ABNORMAL HIGH (ref 70–99)

## 2018-04-01 MED ORDER — ESTROGENS CONJUGATED 25 MG IJ SOLR
25.0000 mg | Freq: Four times a day (QID) | INTRAMUSCULAR | Status: AC
  Administered 2018-04-02 (×4): 25 mg via INTRAVENOUS
  Filled 2018-04-01 (×6): qty 25

## 2018-04-01 MED ORDER — DEXTROSE-NACL 5-0.45 % IV SOLN
INTRAVENOUS | Status: DC
  Administered 2018-04-02: 04:00:00 via INTRAVENOUS

## 2018-04-01 MED ORDER — SODIUM CHLORIDE 0.9 % IV SOLN: INTRAVENOUS | Status: AC

## 2018-04-01 MED ORDER — ACETAMINOPHEN 325 MG PO TABS
650.0000 mg | ORAL_TABLET | Freq: Four times a day (QID) | ORAL | Status: DC | PRN
  Administered 2018-04-02: 650 mg via ORAL
  Filled 2018-04-01 (×2): qty 2

## 2018-04-01 MED ORDER — MEGESTROL ACETATE 40 MG PO TABS
120.0000 mg | ORAL_TABLET | Freq: Every day | ORAL | Status: DC
  Administered 2018-04-02 – 2018-04-03 (×2): 120 mg via ORAL
  Filled 2018-04-01 (×3): qty 3

## 2018-04-01 MED ORDER — SODIUM CHLORIDE 0.9 % IV BOLUS
1000.0000 mL | Freq: Once | INTRAVENOUS | Status: AC
  Administered 2018-04-01: 1000 mL via INTRAVENOUS

## 2018-04-01 MED ORDER — POTASSIUM CHLORIDE 10 MEQ/100ML IV SOLN
10.0000 meq | INTRAVENOUS | Status: AC
  Administered 2018-04-02 (×2): 10 meq via INTRAVENOUS
  Filled 2018-04-01 (×2): qty 100

## 2018-04-01 MED ORDER — INSULIN REGULAR(HUMAN) IN NACL 100-0.9 UT/100ML-% IV SOLN
INTRAVENOUS | Status: DC
  Administered 2018-04-02: 3.1 [IU]/h via INTRAVENOUS
  Filled 2018-04-01: qty 100

## 2018-04-01 MED ORDER — SODIUM CHLORIDE 0.9 % IV SOLN
INTRAVENOUS | Status: DC
  Administered 2018-04-02 – 2018-04-03 (×2): via INTRAVENOUS

## 2018-04-01 MED ORDER — SODIUM CHLORIDE 0.9% IV SOLUTION
Freq: Once | INTRAVENOUS | Status: AC
  Administered 2018-04-02: 04:00:00 via INTRAVENOUS

## 2018-04-01 MED ORDER — ACETAMINOPHEN 650 MG RE SUPP: 650.0000 mg | Freq: Four times a day (QID) | RECTAL | Status: DC | PRN

## 2018-04-01 NOTE — ED Triage Notes (Signed)
Vaginal bleeding x 3 weeks, weakness  Has not been seen for this

## 2018-04-01 NOTE — ED Notes (Signed)
[facility] notified (Kim) - Patient ready for transport 

## 2018-04-01 NOTE — Progress Notes (Signed)
MD at the bedside  

## 2018-04-01 NOTE — ED Notes (Signed)
Date and time results received: 04/01/18 1923 (use smartphrase ".now" to insert current time)  Test: hemoglobin Critical Value: 5.8  Name of Provider Notified: Dr. Tanna Savoy  Orders Received? Or Actions Taken?: no new orders at this time.

## 2018-04-01 NOTE — Progress Notes (Signed)
Pt has arrived to [address]. Admission notified. Alert and oriented x 4, identified appropriately. VS stable. Pt denied SOB and chest pain, c/o back pain 8/10, and dizziness. No signs of acute distress. Cardiac monitor placed on pt and progressive care monitor in place. Pt oriented to room and equipment, instructed to call for assistance, pt verbalized understanding. Call bell left within reach and bed alarm turned on.  Will continue to monitor and treat pt.

## 2018-04-01 NOTE — ED Provider Notes (Signed)
[facility] EMERGENCY DEPARTMENT Provider Note   CSN: [id number] Arrival date & time: 04/01/18  1818   History   Chief Complaint Chief Complaint  Patient presents with  . Vaginal Bleeding    HPI Ann Berry is a 33 y.o. female with PMH of T2DM.  Patient coming in by mom because she woke up in a "puddle of blood" after her nap today.  Patient states that she has had 3 weeks of vaginal bleeding and she has been bleeding through 5-6 pads per day.  States that this is not the first time that she has woken up in a puddle of blood.  Patient reports that she has been feeling weak but is otherwise okay.  States that she has not been able to follow-up with her regular doctor.  Says that she has type 1 diabetes and ran out of her insulin on Monday.     Past Medical History:  Diagnosis Date  . Diabetes mellitus     Patient Active Problem List   Diagnosis Date Noted  . DIABETES MELLITUS, TYPE II 12/29/2009  . PYELONEPHRITIS 12/04/2009    History reviewed. No pertinent surgical history.   OB History   No obstetric history on file.     Home Medications    Prior to Admission medications   Medication Sig Start Date End Date Taking? Authorizing Provider  ibuprofen (ADVIL,MOTRIN) 200 MG tablet Take 400 mg by mouth every 6 (six) hours as needed. Pain.    [provider]    Family History No family history on file.  Social History Social History   Tobacco Use  . Smoking status: Not on file  Substance Use Topics  . Alcohol use: Yes  . Drug use: No    Allergies   Patient has no known allergies.  Review of Systems Review of Systems  Constitutional: Positive for fatigue. Negative for chills and fever.  Respiratory: Negative for cough and shortness of breath.   Cardiovascular: Negative for chest pain and leg swelling.  Gastrointestinal: Negative for diarrhea, nausea and vomiting.  Genitourinary: Positive for vaginal bleeding. Negative for  vaginal pain.  All other systems reviewed and are negative.  Physical Exam Updated Vital Signs BP 124/75   Pulse (!) 110   Temp 98.1 F (36.7 C) (Oral)   Resp 16   Ht 5\' 11"  (1.803 m)   Wt 73.5 kg   LMP 04/01/2018   SpO2 100%   BMI 22.59 kg/m   Physical Exam Vitals signs and nursing note reviewed.  Constitutional:      Appearance: Normal appearance. She is normal weight. She is ill-appearing.  HENT:     Head: Normocephalic and atraumatic.     Nose: Nose normal.     Mouth/Throat:     Mouth: Mucous membranes are dry.  Eyes:     Extraocular Movements: Extraocular movements intact.     Pupils: Pupils are equal, round, and reactive to light.     Comments: Pale conjunctivae   Neck:     Musculoskeletal: Normal range of motion and neck supple.  Cardiovascular:     Rate and Rhythm: Regular rhythm. Tachycardia present.  Pulmonary:     Effort: Pulmonary effort is normal.     Breath sounds: Normal breath sounds.  Abdominal:     General: Abdomen is flat. Bowel sounds are normal. There is no distension.     Palpations: Abdomen is soft.  Skin:    General: Skin is dry.  Capillary Refill: Capillary refill takes more than 3 seconds.     Coloration: Skin is pale.  Neurological:     General: No focal deficit present.     Mental Status: She is alert. Mental status is at baseline.  Psychiatric:        Mood and Affect: Mood normal.        Behavior: Behavior normal.    ED Treatments / Results  Labs (all labs ordered are listed, but only abnormal results are displayed) Labs Reviewed  CBC WITH DIFFERENTIAL/PLATELET - Abnormal; Notable for the following components:      Result Value   RBC 2.46 (*)    Hemoglobin 5.8 (*)    HCT 18.6 (*)    MCV 75.6 (*)    MCH 23.6 (*)    RDW 15.6 (*)    All other components within normal limits  BASIC METABOLIC PANEL - Abnormal; Notable for the following components:   Sodium 126 (*)    Chloride 91 (*)    Glucose, Bld 733 (*)    Calcium 8.2  (*)    All other components within normal limits  CBG MONITORING, ED - Abnormal; Notable for the following components:   Glucose-Capillary >600 (*)    All other components within normal limits  POCT I-STAT EG7 - Abnormal; Notable for the following components:   pO2, Ven 20.0 (*)    Bicarbonate 30.0 (*)    Acid-Base Excess 4.0 (*)    Sodium 128 (*)    HCT 19.0 (*)    Hemoglobin 6.5 (*)    All other components within normal limits  PREGNANCY, URINE    EKG None  Radiology No results found.  Procedures Procedures (including critical care time)  Medications Ordered in ED Medications  sodium chloride 0.9 % bolus 1,000 mL (has no administration in time range)  sodium chloride 0.9 % bolus 1,000 mL (has no administration in time range)  megestrol (MEGACE) tablet 120 mg (has no administration in time range)     Initial Impression / Assessment and Plan / ED Course  I have reviewed the triage vital signs and the nursing notes.  Pertinent labs & imaging results that were available during my care of the patient were reviewed by me and considered in my medical decision making (see chart for details).   33 year old female with past medical history of T2DM who presented to the emergency room appearing ill and fatigued.  Patient initially with low BP and tachycardic and physicians called into the room to evaluate patient.  Initial CBG was too high to read.  Started patient on 2 L normal saline for low BP.  Obtained stat labs in order to rule out DKA as cause as patient was reporting that she had T1DM.  VBG shows 7.37.  Point-of-care hemoglobin 6.5.  Labs revealed the patient was not in DKA and on further chart review appears that patient has T2DM.  Patient has been without insulin since Monday.  After receiving fluids patient's blood pressure has improved to 124/75 however hemoglobin returned at 5.8. grossly normal BMP with sodium of 126 and glucose of 733. Urine pregnancy test negative.  Will  need admission for blood products.  Paged for admission to hospitalist.  Consulted OB/GYN Dr. Despina HiddenEure who recommended 25mg  IV primarin every 6 hr for 4 doses and Megace 120 mg now and qday.  There is no Premarin in this location but will administer Megace 120 mg now.  OB[facility]N will consult on patient [facility] and will require outpatient follow-up.  With patient's improvement in blood pressure after receiving 2 L normal saline [facility]  [place] Brianne Maina, DO PGY-2, [facility]   Final Clinical Impressions(s) / ED Diagnoses   Final diagnoses:  Other iron deficiency anemia  Abnormal uterine bleeding    ED Discharge Orders    None       Rafiel Mecca, [place], DO 04/01/18 2034    Gwyneth SproutPlunkett, [place], MD 04/01/18 2327

## 2018-04-01 NOTE — ED Notes (Signed)
Pt to [facility] w/o difficulty denies dizziness

## 2018-04-02 ENCOUNTER — Encounter (HOSPITAL_COMMUNITY): Payer: Self-pay

## 2018-04-02 ENCOUNTER — Observation Stay (HOSPITAL_COMMUNITY): Payer: Medicaid Other

## 2018-04-02 DIAGNOSIS — D649 Anemia, unspecified: Secondary | ICD-10-CM

## 2018-04-02 DIAGNOSIS — D508 Other iron deficiency anemias: Secondary | ICD-10-CM | POA: Diagnosis present

## 2018-04-02 DIAGNOSIS — N939 Abnormal uterine and vaginal bleeding, unspecified: Secondary | ICD-10-CM

## 2018-04-02 DIAGNOSIS — N920 Excessive and frequent menstruation with regular cycle: Secondary | ICD-10-CM | POA: Diagnosis present

## 2018-04-02 DIAGNOSIS — Z794 Long term (current) use of insulin: Secondary | ICD-10-CM | POA: Diagnosis not present

## 2018-04-02 DIAGNOSIS — I959 Hypotension, unspecified: Secondary | ICD-10-CM | POA: Diagnosis present

## 2018-04-02 DIAGNOSIS — E1165 Type 2 diabetes mellitus with hyperglycemia: Secondary | ICD-10-CM | POA: Diagnosis not present

## 2018-04-02 DIAGNOSIS — Z7289 Other problems related to lifestyle: Secondary | ICD-10-CM | POA: Diagnosis not present

## 2018-04-02 DIAGNOSIS — E871 Hypo-osmolality and hyponatremia: Secondary | ICD-10-CM | POA: Diagnosis present

## 2018-04-02 DIAGNOSIS — E118 Type 2 diabetes mellitus with unspecified complications: Secondary | ICD-10-CM | POA: Diagnosis not present

## 2018-04-02 DIAGNOSIS — D62 Acute posthemorrhagic anemia: Secondary | ICD-10-CM | POA: Diagnosis present

## 2018-04-02 DIAGNOSIS — R739 Hyperglycemia, unspecified: Secondary | ICD-10-CM | POA: Diagnosis not present

## 2018-04-02 DIAGNOSIS — R4182 Altered mental status, unspecified: Secondary | ICD-10-CM | POA: Diagnosis not present

## 2018-04-02 DIAGNOSIS — E1065 Type 1 diabetes mellitus with hyperglycemia: Secondary | ICD-10-CM | POA: Diagnosis present

## 2018-04-02 DIAGNOSIS — R338 Other retention of urine: Secondary | ICD-10-CM | POA: Diagnosis present

## 2018-04-02 DIAGNOSIS — T383X6A Underdosing of insulin and oral hypoglycemic [antidiabetic] drugs, initial encounter: Secondary | ICD-10-CM | POA: Diagnosis present

## 2018-04-02 LAB — IRON AND TIBC
Iron: 38 ug/dL (ref 28–170)
Saturation Ratios: 13 % (ref 10.4–31.8)
TIBC: 293 ug/dL (ref 250–450)
UIBC: 255 ug/dL

## 2018-04-02 LAB — GLUCOSE, CAPILLARY
GLUCOSE-CAPILLARY: 208 mg/dL — AB (ref 70–99)
GLUCOSE-CAPILLARY: 311 mg/dL — AB (ref 70–99)
GLUCOSE-CAPILLARY: 366 mg/dL — AB (ref 70–99)
Glucose-Capillary: 376 mg/dL — ABNORMAL HIGH (ref 70–99)
Glucose-Capillary: 269 mg/dL — ABNORMAL HIGH (ref 70–99)
Glucose-Capillary: 161 mg/dL — ABNORMAL HIGH (ref 70–99)
Glucose-Capillary: 167 mg/dL — ABNORMAL HIGH (ref 70–99)
Glucose-Capillary: 163 mg/dL — ABNORMAL HIGH (ref 70–99)
Glucose-Capillary: 170 mg/dL — ABNORMAL HIGH (ref 70–99)
Glucose-Capillary: 224 mg/dL — ABNORMAL HIGH (ref 70–99)
Glucose-Capillary: 241 mg/dL — ABNORMAL HIGH (ref 70–99)

## 2018-04-02 LAB — CBC WITH DIFFERENTIAL/PLATELET
ABS IMMATURE GRANULOCYTES: 0.05 10*3/uL (ref 0.00–0.07)
Basophils Absolute: 0 10*3/uL (ref 0.0–0.1)
Basophils Relative: 0 %
Eosinophils Absolute: 0.1 10*3/uL (ref 0.0–0.5)
Eosinophils Relative: 1 %
HCT: 23 % — ABNORMAL LOW (ref 36.0–46.0)
Hemoglobin: 7.8 g/dL — ABNORMAL LOW (ref 12.0–15.0)
Immature Granulocytes: 0 %
LYMPHS ABS: 3.1 10*3/uL (ref 0.7–4.0)
Lymphocytes Relative: 23 %
MCH: 26.7 pg (ref 26.0–34.0)
MCHC: 33.9 g/dL (ref 30.0–36.0)
MCV: 78.8 fL — ABNORMAL LOW (ref 80.0–100.0)
Monocytes Absolute: 0.6 10*3/uL (ref 0.1–1.0)
Monocytes Relative: 5 %
Neutro Abs: 9.6 10*3/uL — ABNORMAL HIGH (ref 1.7–7.7)
Neutrophils Relative %: 71 %
Platelets: 301 10*3/uL (ref 150–400)
RBC: 2.92 MIL/uL — ABNORMAL LOW (ref 3.87–5.11)
RDW: 15.7 % — ABNORMAL HIGH (ref 11.5–15.5)
WBC: 13.5 10*3/uL — ABNORMAL HIGH (ref 4.0–10.5)
nRBC: 0 % (ref 0.0–0.2)

## 2018-04-02 LAB — BASIC METABOLIC PANEL
Anion gap: 5 (ref 5–15)
BUN: 8 mg/dL (ref 6–20)
CO2: 22 mmol/L (ref 22–32)
Calcium: 7.2 mg/dL — ABNORMAL LOW (ref 8.9–10.3)
Chloride: 109 mmol/L (ref 98–111)
Creatinine, Ser: 0.73 mg/dL (ref 0.44–1.00)
GFR calc Af Amer: >60 mL/min (ref >60)
GFR calc non Af Amer: >60 mL/min (ref >60)
Glucose, Bld: 211 mg/dL — ABNORMAL HIGH (ref 70–99)
POTASSIUM: 3.5 mmol/L (ref 3.5–5.1)
Sodium: 136 mmol/L (ref 135–145)

## 2018-04-02 LAB — CBC
HCT: 21.6 % — ABNORMAL LOW (ref 36.0–46.0)
HEMOGLOBIN: 7.1 g/dL — AB (ref 12.0–15.0)
MCH: 25.7 pg — ABNORMAL LOW (ref 26.0–34.0)
MCHC: 32.9 g/dL (ref 30.0–36.0)
MCV: 78.3 fL — ABNORMAL LOW (ref 80.0–100.0)
Platelets: 307 10*3/uL (ref 150–400)
RBC: 2.76 MIL/uL — ABNORMAL LOW (ref 3.87–5.11)
RDW: 15.6 % — ABNORMAL HIGH (ref 11.5–15.5)
WBC: 13 10*3/uL — ABNORMAL HIGH (ref 4.0–10.5)
nRBC: 0 % (ref 0.0–0.2)

## 2018-04-02 LAB — PROTIME-INR
INR: 1.08
Prothrombin Time: 13.9 seconds (ref 11.4–15.2)

## 2018-04-02 LAB — PREPARE RBC (CROSSMATCH)

## 2018-04-02 LAB — MRSA PCR SCREENING: MRSA by PCR: NEGATIVE

## 2018-04-02 LAB — ABO/RH: ABO/RH(D): O POS

## 2018-04-02 LAB — FERRITIN: Ferritin: 16 ng/mL (ref 11–307)

## 2018-04-02 LAB — APTT: aPTT: 29 s (ref 24–36)

## 2018-04-02 LAB — HIV ANTIBODY (ROUTINE TESTING W REFLEX): HIV Screen 4th Generation wRfx: NONREACTIVE

## 2018-04-02 MED ORDER — SODIUM CHLORIDE 0.9% IV SOLUTION: Freq: Once | INTRAVENOUS | Status: DC

## 2018-04-02 MED ORDER — INSULIN ASPART 100 UNIT/ML ~~LOC~~ SOLN
0.0000 [IU] | Freq: Every day | SUBCUTANEOUS | Status: DC
  Administered 2018-04-02: 4 [IU] via SUBCUTANEOUS

## 2018-04-02 MED ORDER — ACETAMINOPHEN 325 MG PO TABS
650.0000 mg | ORAL_TABLET | Freq: Once | ORAL | Status: AC
  Administered 2018-04-02: 650 mg via ORAL
  Filled 2018-04-02: qty 2

## 2018-04-02 MED ORDER — INSULIN GLARGINE 100 UNIT/ML ~~LOC~~ SOLN
10.0000 [IU] | Freq: Every day | SUBCUTANEOUS | Status: DC
  Administered 2018-04-02: 10 [IU] via SUBCUTANEOUS
  Filled 2018-04-02 (×3): qty 0.1

## 2018-04-02 MED ORDER — PROMETHAZINE HCL 25 MG/ML IJ SOLN
25.0000 mg | Freq: Four times a day (QID) | INTRAMUSCULAR | Status: DC | PRN
  Administered 2018-04-02: 25 mg via INTRAVENOUS
  Filled 2018-04-02: qty 1

## 2018-04-02 MED ORDER — DIPHENHYDRAMINE HCL 50 MG/ML IJ SOLN
25.0000 mg | Freq: Once | INTRAMUSCULAR | Status: AC
  Administered 2018-04-02: 25 mg via INTRAVENOUS
  Filled 2018-04-02: qty 1

## 2018-04-02 MED ORDER — ONDANSETRON HCL 4 MG/2ML IJ SOLN
4.0000 mg | Freq: Three times a day (TID) | INTRAMUSCULAR | Status: DC | PRN
  Administered 2018-04-02: 4 mg via INTRAVENOUS
  Filled 2018-04-02: qty 2

## 2018-04-02 MED ORDER — ONDANSETRON HCL 4 MG/2ML IJ SOLN: 4.0000 mg | Freq: Three times a day (TID) | INTRAMUSCULAR | Status: DC

## 2018-04-02 MED ORDER — INSULIN ASPART 100 UNIT/ML ~~LOC~~ SOLN
0.0000 [IU] | Freq: Three times a day (TID) | SUBCUTANEOUS | Status: DC
  Administered 2018-04-02 (×3): 3 [IU] via SUBCUTANEOUS
  Administered 2018-04-03 (×2): 5 [IU] via SUBCUTANEOUS

## 2018-04-02 MED ORDER — FUROSEMIDE 10 MG/ML IJ SOLN
20.0000 mg | Freq: Once | INTRAMUSCULAR | Status: AC
  Administered 2018-04-02: 20 mg via INTRAVENOUS
  Filled 2018-04-02: qty 2

## 2018-04-02 NOTE — Progress Notes (Signed)
Pt actively bleeding since admission, two pads saturated with blood and 1 medium blood clot passed.

## 2018-04-02 NOTE — Progress Notes (Signed)
Informed Dr. Jerral Ralph that patients blood if finished and order has been placed for repeat CBC. Informed MD that patient is still lethargic and thinks it's 12/19/2016.

## 2018-04-02 NOTE — Progress Notes (Signed)
Seen patient earlier this afternoon Lying comfortably in bed Completely awake and alert Moving all 4 extremities  Per RN still with heavy vaginal bleeding CBC this evening with decrease in Hb to 7.1  Plan Transfuse one additional unit Per my d/w GYN MD on call-to continue IV premarin x4, megace-and if still bleeding heavily tomorrow-will touch base for a formal consult.

## 2018-04-02 NOTE — Progress Notes (Signed)
Per MD when patient is more awake Remove foley and PRN in and out cath patient as needed.

## 2018-04-02 NOTE — Progress Notes (Addendum)
Informed Dr. Jerral Ralph that patient is awake and alert and  is having numbness and tingling all over her body states her neuropathy is usally only in her feet. MD stated to keep monitoring patient, remove foley and place patient in chair.

## 2018-04-02 NOTE — H&P (Addendum)
History and Physical    Ann Berry ZOX:[id number] DOB: [birth year] DOA: 04/01/2018  PCP: Patient, No Pcp Per Patient coming from: [facility]  Chief Complaint: Vaginal bleeding  HPI: Ann Berry is a 33 y.o. female with medical history significant of insulin-dependent diabetes mellitus presenting as a transfer from [facility] for further management of vaginal bleeding and hyperglycemia.  Patient states normally her menstrual cycles last about a week.  States 3 weeks ago her menstrual cycle started but has continued and bleeding has been getting progressively worse.  She has been changing 5-6 pads per day.  Reports having abdominal cramps.  No nausea or vomiting.  Reports feeling dizzy and a little short of breath.  Denies having any chest pain.  States she had blood transfusions last year for the same problem.  No adverse reaction from the blood transfusions.  States she does not see an OB/GYN.  Reports history of 4 pregnancies-2 live births, 1 miscarriage, and 1 abortion.  Her last pregnancy was in 2014.  She is currently sexually active and always uses barrier contraceptives.  States she is on insulin for her diabetes but ran out of medications 2 months ago.  She does not check her blood sugar at home.  Review of Systems: As per HPI otherwise 10 point review of systems negative.  Past Medical History:  Diagnosis Date  . Diabetes mellitus     History reviewed. No pertinent surgical history.   reports current alcohol use. She reports that she does not use drugs. No history on file for tobacco.  No Known Allergies  No family history on file.  Prior to Admission medications   Medication Sig Start Date End Date Taking? Authorizing Provider  ibuprofen (ADVIL,MOTRIN) 200 MG tablet Take 400 mg by mouth every 6 (six) hours as needed. Pain.    [provider]    Physical Exam: Vitals:   04/01/18 2100 04/01/18 2130 04/01/18 2230  04/02/18 0356  BP: 113/68 116/69 114/64 (!) 89/47  Pulse: (!) 113 (!) 110 (!) 112   Resp: 16 (!) 23 15   Temp:   98.3 F (36.8 C) 99.3 F (37.4 C)  TempSrc:   Oral Oral  SpO2: 100% 100% 100%   Weight:      Height:        Physical Exam  Constitutional: She is oriented to person, place, and time. She appears well-developed and well-nourished.  Appears fatigued  HENT:  Head: Normocephalic.  Mouth/Throat: Oropharynx is clear and moist.  Eyes:  Conjunctival pallor  Neck: Neck supple.  Cardiovascular: Regular rhythm and intact distal pulses.  Slightly tachycardic  Pulmonary/Chest: Effort normal and breath sounds normal. No respiratory distress. She has no wheezes. She has no rales.  Abdominal: Soft. Bowel sounds are normal. She exhibits no distension. There is no abdominal tenderness. There is no guarding.  Genitourinary:    Genitourinary Comments: Chaperone present at bedside. Clotted blood noted at the vaginal introitus.  Sanitary pad soaked with blood.   Musculoskeletal:        General: No edema.  Neurological: She is alert and oriented to person, place, and time.  Skin: Skin is warm and dry. She is not diaphoretic.     Labs on Admission: I have personally reviewed following labs and imaging studies  CBC: Recent Labs  Lab 04/01/18 1853 04/01/18 1854  WBC 8.2  --   NEUTROABS 5.0  --   HGB 5.8* 6.5*  HCT 18.6*  19.0*  MCV 75.6*  --   PLT 396  --    Basic Metabolic Panel: Recent Labs  Lab 04/01/18 1853 04/01/18 1854  NA 126* 128*  K 3.7 3.9  CL 91*  --   CO2 27  --   GLUCOSE 733*  --   BUN 13  --   CREATININE 0.96  --   CALCIUM 8.2*  --    GFR: Estimated Creatinine Clearance: 94.9 mL/min (by C-G formula based on SCr of 0.96 mg/dL). Liver Function Tests: No results for input(s): AST, ALT, ALKPHOS, BILITOT, PROT, ALBUMIN in the last 168 hours. No results for input(s): LIPASE, AMYLASE in the last 168 hours. No results for input(s): AMMONIA in the last 168  hours. Coagulation Profile: Recent Labs  Lab 04/02/18 0023  INR 1.08   Cardiac Enzymes: No results for input(s): CKTOTAL, CKMB, CKMBINDEX, TROPONINI in the last 168 hours. BNP (last 3 results) No results for input(s): PROBNP in the last 8760 hours. HbA1C: No results for input(s): HGBA1C in the last 72 hours. CBG: Recent Labs  Lab 04/01/18 2233 04/02/18 0018 04/02/18 0122 04/02/18 0227 04/02/18 0333  GLUCAP 465* 366* 376* 269* 161*   Lipid Profile: No results for input(s): CHOL, HDL, LDLCALC, TRIG, CHOLHDL, LDLDIRECT in the last 72 hours. Thyroid Function Tests: No results for input(s): TSH, T4TOTAL, FREET4, T3FREE, THYROIDAB in the last 72 hours. Anemia Panel: No results for input(s): VITAMINB12, FOLATE, FERRITIN, TIBC, IRON, RETICCTPCT in the last 72 hours. Urine analysis:    Component Value Date/Time   COLORURINE YELLOW 05/16/2011 1710   APPEARANCEUR CLEAR 05/16/2011 1710   LABSPEC 1.034 (H) 05/16/2011 1710   PHURINE 6.0 05/16/2011 1710   GLUCOSEU >1000 (A) 05/16/2011 1710   HGBUR LARGE (A) 05/16/2011 1710   BILIRUBINUR NEGATIVE 05/16/2011 1710   KETONESUR NEGATIVE 05/16/2011 1710   PROTEINUR NEGATIVE 05/16/2011 1710   UROBILINOGEN 0.2 05/16/2011 1710   NITRITE NEGATIVE 05/16/2011 1710   LEUKOCYTESUR NEGATIVE 05/16/2011 1710    Radiological Exams on Admission: US Pelvic Complete With Transvaginal  Result Date: 04/02/2018 CLINICAL DATA:  Heavy vaginal bleeding EXAM: TRANSABDOMINAL AND TRANSVAGINAL ULTRASOUND OF PELVIS TECHNIQUE: Both transabdominal and transvaginal ultrasound examinations of the pelvis were performed. Transabdominal technique was performed for global imaging of the pelvis including uterus, ovaries, adnexal regions, and pelvic cul-de-sac. It was necessary to proceed with endovaginal exam following the transabdominal exam to visualize the uterus endometrium and ovaries. COMPARISON:  Pelvic ultrasound 08/23/2017 FINDINGS: Uterus Measurements: 9.3 x 4.1  x 5.2 cm = volume: 106.8 mL. Heterogenous material within the lower uterine segment and cervix measuring 4 x 4 cm. Endometrium Thickness: 2.1 mm.  No focal abnormality visualized. Right ovary Measurements: 5.1 x 2.7 x 3.1 cm = volume: 23.4 mL. Cyst measuring 2.7 x 2 cm Left ovary Measurements: 2.1 x 1.7 x 1.3 cm = volume: 2.6 mL. Normal appearance/no adnexal mass. Other findings No abnormal free fluid. IMPRESSION: Heterogenous appearance to the lower uterine segment and cervix. Unclear if this represents hemorrhagic material/blood products versus ill-defined lower uterine segment/cervical mass. No other significant abnormalities are seen. Endometrial thickness is normal at 2.1 mm Electronically Signed   By: Jasmine Pang M.D.   On: 04/02/2018 03:27    Assessment/Plan Principal Problem:   Abnormal uterine bleeding Active Problems:   Symptomatic anemia   Hyperglycemia   Abnormal uterine bleeding/ menorrhagia, symptomatic anemia -3-week history of vaginal bleeding -Slightly tachycardic.  Blood pressure 88/69 in the ED, improved with IV fluid resuscitation.  Most recent  blood pressure 114/64. -Hemoglobin 5.8 and MCV 75, no recent baseline. -Urine pregnancy test negative. -ED provider spoke to OB/GYN Dr. Despina Hidden who recommended giving Megace 120 mg now, then daily and IV Premarin 25 mg every 6 hours x 4 doses.  OB/GYN will see the patient in the morning. -2 units PRBCs; posttransfusion H&H -IV fluid resuscitation -Pelvic ultrasound -Check iron, ferritin, TIBC -Tylenol PRN abdominal cramps -Continue to monitor CBC -Check orthostatics  Hyperglycemia -History of insulin-dependent diabetes mellitus and patient has not taken any insulin for the past 2 months. -Blood glucose 733 in the ED.  Anion gap and bicarb normal.  VBG with pH 7.37.  Patient was started on IV insulin.  Most recent CBG 161. -Start Lantus 10 units. Resume diet. Discontinue IV insulin in 1 hour. -Sliding scale insulin after IV insulin  is discontinued    DVT prophylaxis: SCDs Code Status: Full code Family Communication: No family available. Disposition Plan: Anticipate discharge after clinical improvement. Consults called: OB/GYN Admission status: Observation   John Giovanni MD [facility] Pager [phone]  If 7PM-7AM, please contact night-coverage [email or web address] Password [email or web address]  04/02/2018, 4:03 AM

## 2018-04-02 NOTE — Progress Notes (Signed)
Patient is alert on oriented states she doesn't remember receiving blood or having foley put in place. Explained to patient everything that has happened this shift and explaned that foley cath will be coming out now that she is awake. Patient wanted to try to sit up and use the restroom. With help from NT patient sat up on edge of bed. Patient started to get dizzy and slid slid to the floor on her knees. Bed was in lowest position. Patient states she didn't hurt herself and doesn't want family to be notified. Put patient back to bed. VSS stable. On floor Large blood clot noted. Informed MD of Fall and Clot. Per MD keep monitoring patient.

## 2018-04-02 NOTE — Significant Event (Signed)
Rapid Response Event Note  Overview:  Called by charge RN stating pt less responsive and hypotensive. Pt has been receiving the first of 2 units PRBC ordered.     Initial Focused Assessment: On arrival, pt in bed with NAD noted. Pt minimally responsive to painful stimuli. Pt not following commands but does open eyes and moves all extremities spontaneously. HR 90s, BP 82/51, RR 14, spO2 100% on RA, lungs clear throughout. Per bedside RN, pt has saturated 3 pads since arrival and has not had recent labwork since admission.   Interventions: 1L bolus, increase blood rate in order to finish 1st unit. CBG 170, Labs ordered STAT, bladder scan- pt retaining, foley ordered. NP Blount at bedside to assess pt.   Plan of Care (if not transferred): Continue to monitor pt neuro status and vitals. Awaiting stat labs (1st unit of blood completed), place foley. NP agrees with plan. Will order more blood if needed. If not more awake after blood products may need head CT. RN instructed to call with any questions or concerns.   Event Summary:  called at  0555   event ended at  0640        Ann Berry

## 2018-04-02 NOTE — Progress Notes (Signed)
Pt on reassessment minimally responsive, opens eyes to touch, but unable to stay awake. Oriented to self and place at the times. Hypotensive, other VS stable, denied chest pain and SOB. RR nurse called and Bruna Potter, NP assessed pt. This morning easier to arouse, seems very tired and sleepy.

## 2018-04-02 NOTE — Progress Notes (Signed)
PROGRESS NOTE        PATIENT DETAILS Name: Ann Berry Age: 33 y.o. Sex: female Date of Birth: [birth year] Admit Date: 04/01/2018 Admitting Physician John Giovanni, MD ENM:[id number], No Pcp Per  Brief Narrative: Patient is a 33 y.o. female with prior history of insulin-dependent diabetes-noncompliant to medications-presenting to the hospital with 3-week history of vaginal bleeding, acute blood loss anemia and uncontrolled diabetes with hyperglycemia.  See below for further details  Subjective: Very sleepy this morning-claims that she has not slept at all last night.  Awakes easily-answer her name-no she is in the hospital-acknowledges that she has vaginal bleeding.  Dozes back to sleep but then wakes right back up.  Currently getting a second unit of PRBC.  Per RN-patient still bleeding pretty heavily from her vagina and soaking up 1 pad every few hours.  Assessment/Plan: Acute blood loss anemia secondary to significant vaginal bleeding: Continue PRBC transfusion-continue Megace and IV Premarin x4.  Transvaginal ultrasound does not show any major abnormalities.  Suspect she may have dysfunctional uterine bleeding.  Spoke with GYN MD on-call-Dr. Constant-recommendations are to continue with current treatment plan-bleeding should somewhat slow down in a day or so.  Per GYN MD-to call back tomorrow for official consult patient continues to have significant/unchanged vaginal bleeding.  Uncontrolled DM-1 with hyperglycemia: Initially required insulin drip-unfortunately has not been compliant with insulin therapy.  CBGs currently stable-continue 10 units of Lantus and SSI.  Check A1c  DVT Prophylaxis: SCD's  Code Status: Full code   Family Communication: None at bedside  Disposition Plan: Remain inpatient  Antimicrobial agents: Anti-infectives (From admission, onward)   None     Procedures: None  CONSULTS:  GYN MD on call over the  phone  Time spent: 25 minutes-Greater than 50% of this time was spent in counseling, explanation of diagnosis, planning of further management, and coordination of care.  MEDICATIONS: Scheduled Meds: . conjugated estrogens  25 mg Intravenous Q6H  . insulin aspart  0-5 Units Subcutaneous QHS  . insulin aspart  0-9 Units Subcutaneous TID WC  . insulin glargine  10 Units Subcutaneous Daily  . megestrol  120 mg Oral Daily   Continuous Infusions: . sodium chloride Stopped (04/02/18 0957)  . dextrose 5 % and 0.45% NaCl 150 mL/hr at 04/02/18 1000  . insulin Stopped (04/02/18 1103)   PRN Meds:.acetaminophen **OR** acetaminophen, ondansetron (ZOFRAN) IV   PHYSICAL EXAM: Vital signs: Vitals:   04/02/18 0645 04/02/18 0748 04/02/18 0808 04/02/18 1000  BP:  (!) 100/54 (!) 96/56 105/62  Pulse:  (!) 102 100 (!) 102  Resp:  18 18 13   Temp: 98.7 F (37.1 C) 98.1 F (36.7 C) 98.3 F (36.8 C) 98.2 F (36.8 C)  TempSrc: Oral Axillary Axillary Axillary  SpO2:  100% 100% 100%  Weight:      Height:       Filed Weights   04/01/18 1836  Weight: 73.5 kg   Body mass index is 22.59 kg/m.   General appearance :Awake, alert, not in any distress.  HEENT: Atraumatic and Normocephalic Neck: supple Resp:Good air entry bilaterally, no added sounds  CVS: S1 S2 regular, no murmurs.  GI: Bowel sounds present, Non tender and not distended with no gaurding, rigidity or rebound.No organomegaly Extremities: B/L Lower Ext shows no edema, both legs are warm to touch Neurology:  speech clear,Non focal, sensation is  grossly intact. Musculoskeletal:No digital cyanosis Skin:No Rash, warm and dry Wounds:N/A  I have personally reviewed following labs and imaging studies  LABORATORY DATA: CBC: Recent Labs  Lab 04/01/18 1853 04/01/18 1854  WBC 8.2  --   NEUTROABS 5.0  --   HGB 5.8* 6.5*  HCT 18.6* 19.0*  MCV 75.6*  --   PLT 396  --     Basic Metabolic Panel: Recent Labs  Lab 04/01/18 1853  04/01/18 1854  NA 126* 128*  K 3.7 3.9  CL 91*  --   CO2 27  --   GLUCOSE 733*  --   BUN 13  --   CREATININE 0.96  --   CALCIUM 8.2*  --     GFR: Estimated Creatinine Clearance: 94.9 mL/min (by C-G formula based on SCr of 0.96 mg/dL).  Liver Function Tests: No results for input(s): AST, ALT, ALKPHOS, BILITOT, PROT, ALBUMIN in the last 168 hours. No results for input(s): LIPASE, AMYLASE in the last 168 hours. No results for input(s): AMMONIA in the last 168 hours.  Coagulation Profile: Recent Labs  Lab 04/02/18 0023  INR 1.08    Cardiac Enzymes: No results for input(s): CKTOTAL, CKMB, CKMBINDEX, TROPONINI in the last 168 hours.  BNP (last 3 results) No results for input(s): PROBNP in the last 8760 hours.  HbA1C: No results for input(s): HGBA1C in the last 72 hours.  CBG: Recent Labs  Lab 04/02/18 0333 04/02/18 0436 04/02/18 0541 04/02/18 0605 04/02/18 0731  GLUCAP 161* 167* 163* 170* 224*    Lipid Profile: No results for input(s): CHOL, HDL, LDLCALC, TRIG, CHOLHDL, LDLDIRECT in the last 72 hours.  Thyroid Function Tests: No results for input(s): TSH, T4TOTAL, FREET4, T3FREE, THYROIDAB in the last 72 hours.  Anemia Panel: No results for input(s): VITAMINB12, FOLATE, FERRITIN, TIBC, IRON, RETICCTPCT in the last 72 hours.  Urine analysis:    Component Value Date/Time   COLORURINE YELLOW 05/16/2011 1710   APPEARANCEUR CLEAR 05/16/2011 1710   LABSPEC 1.034 (H) 05/16/2011 1710   PHURINE 6.0 05/16/2011 1710   GLUCOSEU >1000 (A) 05/16/2011 1710   HGBUR LARGE (A) 05/16/2011 1710   BILIRUBINUR NEGATIVE 05/16/2011 1710   KETONESUR NEGATIVE 05/16/2011 1710   PROTEINUR NEGATIVE 05/16/2011 1710   UROBILINOGEN 0.2 05/16/2011 1710   NITRITE NEGATIVE 05/16/2011 1710   LEUKOCYTESUR NEGATIVE 05/16/2011 1710    Sepsis Labs: Lactic Acid, Venous    Component Value Date/Time   LATICACIDVEN 1.4 12/06/2009 0115    MICROBIOLOGY: Recent Results (from the past 240  hour(s))  MRSA PCR Screening     Status: None   Collection Time: 04/01/18 11:51 PM  Result Value Ref Range Status   MRSA by PCR NEGATIVE NEGATIVE Final    Comment:        The GeneXpert MRSA Assay (FDA approved for NASAL specimens only), is one component of a comprehensive MRSA colonization surveillance program. It is not intended to diagnose MRSA infection nor to guide or monitor treatment for MRSA infections. Performed at  [facility], [address] [place], [place] [address]     RADIOLOGY STUDIES/RESULTS: [place] Pelvic Complete With Transvaginal  Result Date: 04/02/2018 CLINICAL DATA:  Heavy vaginal bleeding EXAM: TRANSABDOMINAL AND TRANSVAGINAL ULTRASOUND OF PELVIS TECHNIQUE: Both transabdominal and transvaginal ultrasound examinations of the pelvis were performed. Transabdominal technique was performed for global imaging of the pelvis including uterus, ovaries, adnexal regions, and pelvic cul-de-sac. It was necessary to proceed with endovaginal exam following the transabdominal exam to visualize the uterus  endometrium and ovaries. COMPARISON:  Pelvic ultrasound 08/23/2017 FINDINGS: Uterus Measurements: 9.3 x 4.1 x 5.2 cm = volume: 106.8 mL. Heterogenous material within the lower uterine segment and cervix measuring 4 x 4 cm. Endometrium Thickness: 2.1 mm.  No focal abnormality visualized. Right ovary Measurements: 5.1 x 2.7 x 3.1 cm = volume: 23.4 mL. Cyst measuring 2.7 x 2 cm Left ovary Measurements: 2.1 x 1.7 x 1.3 cm = volume: 2.6 mL. Normal appearance/no adnexal mass. Other findings No abnormal free fluid. IMPRESSION: Heterogenous appearance to the lower uterine segment and cervix. Unclear if this represents hemorrhagic material/blood products versus ill-defined lower uterine segment/cervical mass. No other significant abnormalities are seen. Endometrial thickness is normal at 2.1 mm Electronically Signed   By: Jasmine PangKim  Fujinaga M.D.   On: 04/02/2018 03:27     LOS: 0 days   Jeoffrey MassedShanker  Ghimire, MD  [facility]  If 7PM-7AM, please contact night-coverage  Please page via [email or web address]-Password [email or web address]-click on MD name and type text message  04/02/2018, 11:35 AM

## 2018-04-02 NOTE — Progress Notes (Signed)
PT Cancellation Note  Patient Details Name: Ann Berry MRN: [id number] DOB: Mar 26, 1985   Cancelled Treatment:    Reason Eval/Treat Not Completed: Patient not medically ready Spoke to RN regarding patient status, RN requesting hold due to medical status (full body numbness/tingling and patient had just sat up at EOB and slid off onto her knees). Will attempt to try back on next day of service.    Nedra Hai PT, DPT, CBIS  Supplemental Physical Therapist [facility]    Pager [phone] Acute Rehab Office [phone]

## 2018-04-02 NOTE — Progress Notes (Signed)
Received report from Night shift RN. Patient resting in bed extremley lethargic. Stat CBC order. Informed Dr. Jerral Ralph of patient lethargy and CBC order. Per MD hang second unit of blood now and wait to check the CBC 2 hours after blood is finished.

## 2018-04-02 NOTE — Progress Notes (Signed)
Sanitary pad checked at 2027, scant blood, no blood on paper bed pad.   Purewick started after IV push Lasix given at 2310. No additional blood on sanitary pad.   Purewick discontinued at 0210; less than 10 ml bright red blood in canister; no abdominal pain nor swelling.   Paged MD at [phone] to notify of Hgb 7.9 at 0445 after 1 unit of RBCs given. Also notified MD of K+ 3.6 and Ca+ 7.6.   Patient passed large clot at 0645 while on bedpan. Described urge to urinate when passed clot.

## 2018-04-02 NOTE — Progress Notes (Signed)
Called by Dr Loney Loh to assess pt for AMS. On assessment, pt is lethargic, responsive to verbal stimuli but does not respond appropriately to questions. She is oriented to person and place. VSS. She does not appear to be in acute distress. Pt lethargy is most likely from acute blood loss. H/H has not been checked since 1800 on 01/29. Will need to recheck CBC to determine next course of action.  Acute blood loss - Recheck CBC q6hrs -Transfuse PRBCs  Altered mental status -Most likely from fatigue and acute blood loss.  -Frequent neurochecks  Hyponatremia - BMP  Acute urinary retention - Insert foley catheter   Audrea Muscat, NP  [facility] [phone] [phone] CARE  erformed by: Charissa Bash   Total critical care time: 30 minutes  Critical care time was exclusive of separately billable procedures and treating other patients.  Critical care was necessary to treat or prevent imminent or life-threatening deterioration.  Critical care was time spent personally by me on the following activities: development of treatment plan with patient and/or surrogate as well as nursing, discussions with consultants, evaluation of patient's response to treatment, examination of patient, obtaining history from patient or surrogate, ordering and performing treatments and interventions, ordering and review of laboratory studies, ordering and review of radiographic studies, pulse oximetry and re-evaluation of patient's condition.

## 2018-04-03 DIAGNOSIS — E1165 Type 2 diabetes mellitus with hyperglycemia: Secondary | ICD-10-CM

## 2018-04-03 DIAGNOSIS — D62 Acute posthemorrhagic anemia: Secondary | ICD-10-CM

## 2018-04-03 DIAGNOSIS — E118 Type 2 diabetes mellitus with unspecified complications: Secondary | ICD-10-CM

## 2018-04-03 LAB — BPAM RBC
Blood Product Expiration Date: 202002252359
Blood Product Expiration Date: 202003032359
Blood Product Expiration Date: 202003032359
ISSUE DATE / TIME: 202001300404
ISSUE DATE / TIME: 202001300744
ISSUE DATE / TIME: 202001302323
Unit Type and Rh: 5100
Unit Type and Rh: 5100
Unit Type and Rh: 5100

## 2018-04-03 LAB — TYPE AND SCREEN
ABO/RH(D): O POS
Antibody Screen: NEGATIVE
UNIT DIVISION: 0
Unit division: 0
Unit division: 0

## 2018-04-03 LAB — BASIC METABOLIC PANEL
Anion gap: 5 (ref 5–15)
BUN: 9 mg/dL (ref 6–20)
CO2: 25 mmol/L (ref 22–32)
Calcium: 7.6 mg/dL — ABNORMAL LOW (ref 8.9–10.3)
Chloride: 105 mmol/L (ref 98–111)
Creatinine, Ser: 0.77 mg/dL (ref 0.44–1.00)
GFR calc Af Amer: >60 mL/min (ref >60)
Glucose, Bld: 311 mg/dL — ABNORMAL HIGH (ref 70–99)
Potassium: 3.6 mmol/L (ref 3.5–5.1)
SODIUM: 135 mmol/L (ref 135–145)

## 2018-04-03 LAB — CBC
HCT: 24 % — ABNORMAL LOW (ref 36.0–46.0)
Hemoglobin: 7.9 g/dL — ABNORMAL LOW (ref 12.0–15.0)
MCH: 25.9 pg — ABNORMAL LOW (ref 26.0–34.0)
MCHC: 32.9 g/dL (ref 30.0–36.0)
MCV: 78.7 fL — ABNORMAL LOW (ref 80.0–100.0)
PLATELETS: 324 10*3/uL (ref 150–400)
RBC: 3.05 MIL/uL — ABNORMAL LOW (ref 3.87–5.11)
RDW: 15.3 % (ref 11.5–15.5)
WBC: 11.1 10*3/uL — ABNORMAL HIGH (ref 4.0–10.5)
nRBC: 0 % (ref 0.0–0.2)

## 2018-04-03 LAB — GLUCOSE, CAPILLARY
GLUCOSE-CAPILLARY: 285 mg/dL — AB (ref 70–99)
Glucose-Capillary: 254 mg/dL — ABNORMAL HIGH (ref 70–99)

## 2018-04-03 MED ORDER — INSULIN PEN NEEDLE 32G X 8 MM MISC: 0 refills | Status: DC

## 2018-04-03 MED ORDER — MEGESTROL ACETATE 40 MG PO TABS: 40.0000 mg | ORAL_TABLET | Freq: Three times a day (TID) | ORAL | 0 refills | Status: AC

## 2018-04-03 MED ORDER — INSULIN GLARGINE 100 UNIT/ML ~~LOC~~ SOLN
15.0000 [IU] | Freq: Every day | SUBCUTANEOUS | Status: DC
  Administered 2018-04-03: 15 [IU] via SUBCUTANEOUS
  Filled 2018-04-03 (×2): qty 0.15

## 2018-04-03 MED ORDER — INSULIN GLARGINE 100 UNIT/ML SOLOSTAR PEN: 15.0000 [IU] | PEN_INJECTOR | Freq: Every day | SUBCUTANEOUS | 0 refills | Status: DC

## 2018-04-03 MED ORDER — INSULIN PEN NEEDLE 32G X 8 MM MISC: 0 refills | Status: AC

## 2018-04-03 MED ORDER — FREESTYLE LANCETS MISC: 0 refills | Status: AC

## 2018-04-03 MED ORDER — INSULIN ASPART 100 UNIT/ML FLEXPEN: PEN_INJECTOR | SUBCUTANEOUS | 0 refills | Status: AC

## 2018-04-03 MED ORDER — METFORMIN HCL ER 500 MG PO TB24: 500.0000 mg | ORAL_TABLET | Freq: Two times a day (BID) | ORAL | 0 refills | Status: AC

## 2018-04-03 MED ORDER — FERROUS SULFATE 325 (65 FE) MG PO TABS: 325.0000 mg | ORAL_TABLET | Freq: Two times a day (BID) | ORAL | 0 refills | Status: AC

## 2018-04-03 MED ORDER — BLOOD GLUCOSE METER KIT: PACK | 0 refills | Status: AC

## 2018-04-03 MED ORDER — INSULIN GLARGINE 100 UNIT/ML SOLOSTAR PEN: 20.0000 [IU] | PEN_INJECTOR | Freq: Every day | SUBCUTANEOUS | 0 refills | Status: AC

## 2018-04-03 MED ORDER — MEGESTROL ACETATE 40 MG PO TABS: 120.0000 mg | ORAL_TABLET | Freq: Every day | ORAL | 0 refills | Status: DC

## 2018-04-03 MED ORDER — GLUCOSE BLOOD VI STRP: ORAL_STRIP | 0 refills | Status: AC

## 2018-04-03 MED FILL — PENTIPS 31G X 8 MM MISC: 31G X 8 MM | 90 days supply | Qty: 100 | Fill #0

## 2018-04-03 MED FILL — METFORMIN HCL ER 500 MG TAB: 500 | 30 days supply | Qty: 60 | Fill #0

## 2018-04-03 MED FILL — NOVOLOG FLEXPEN SYRINGE: 100 | 33 days supply | Qty: 15 | Fill #0

## 2018-04-03 MED FILL — MEGESTROL 40 MG TABLET: 40 | 30 days supply | Qty: 90 | Fill #0

## 2018-04-03 MED FILL — LANTUS SOLOSTAR 100 UNITS/M: 100 | 30 days supply | Qty: 6 | Fill #0

## 2018-04-03 MED FILL — FERROUS SULFATE 325 MG TAB: 325 (65 FE) | 50 days supply | Qty: 100 | Fill #0

## 2018-04-03 NOTE — Care Management Note (Signed)
Case Management Note  Patient Details  Name: Ann Berry MRN: [id number] Date of Birth: Feb 12, 1986  Subjective/Objective:     Abnormal uterine bleeding.               Action/Plan: Transition to home. No present needs identified per NCM .  Pt states has transportation to home  Expected Discharge Date:  04/03/18               Expected Discharge Plan:  Home/Self Care  In-House Referral:  NA  Discharge planning Services  NA  Post Acute Care Choice:  NA Choice offered to:  NA  DME Arranged:  N/A DME Agency:  NA  HH Arranged:  NA HH Agency:  NA  Status of Service:  Completed, signed off  If discussed at Long Length of Stay Meetings, dates discussed:    Additional Comments:  Epifanio Lesches, RN 04/03/2018, 11:04 AM

## 2018-04-03 NOTE — Progress Notes (Signed)
Patient is discharged home, discharge instruction given to patient, SSI teaching was done. Medications delivered by pharmacy All answered answered and concerns addressed. Patient verbalized understanding.

## 2018-04-03 NOTE — Plan of Care (Signed)
  Problem: Education: Goal: Knowledge of General Education information will improve Description Including pain rating scale, medication(s)/side effects and non-pharmacologic comfort measures 04/03/2018 0906 by Esperanza RichtersAdamah, Jermisha Hoffart F, RN Outcome: Adequate for Discharge 04/03/2018 0906 by Esperanza RichtersAdamah, Mariena Meares F, RN Outcome: Progressing   Problem: Health Behavior/Discharge Planning: Goal: Ability to manage health-related needs will improve 04/03/2018 0906 by Esperanza RichtersAdamah, Korbin Mapps F, RN Outcome: Adequate for Discharge 04/03/2018 0906 by Esperanza RichtersAdamah, Kinisha Soper F, RN Outcome: Progressing   Problem: Clinical Measurements: Goal: Ability to maintain clinical measurements within normal limits will improve 04/03/2018 0906 by Esperanza RichtersAdamah, Maicol Bowland F, RN Outcome: Adequate for Discharge 04/03/2018 0906 by Esperanza RichtersAdamah, Zaina Jenkin F, RN Outcome: Progressing Goal: Will remain free from infection 04/03/2018 0906 by Esperanza RichtersAdamah, Karlita Lichtman F, RN Outcome: Adequate for Discharge 04/03/2018 0906 by Esperanza RichtersAdamah, Kaira Stringfield F, RN Outcome: Progressing Goal: Diagnostic test results will improve Outcome: Adequate for Discharge Goal: Respiratory complications will improve Outcome: Adequate for Discharge Goal: Cardiovascular complication will be avoided Outcome: Adequate for Discharge   Problem: Activity: Goal: Risk for activity intolerance will decrease Outcome: Adequate for Discharge   Problem: Nutrition: Goal: Adequate nutrition will be maintained Outcome: Adequate for Discharge   Problem: Coping: Goal: Level of anxiety will decrease 04/03/2018 0906 by Esperanza RichtersAdamah, Koben Daman F, RN Outcome: Adequate for Discharge 04/03/2018 0906 by Esperanza RichtersAdamah, Iyonnah Ferrante F, RN Outcome: Progressing   Problem: Elimination: Goal: Will not experience complications related to bowel motility Outcome: Adequate for Discharge Goal: Will not experience complications related to urinary retention Outcome: Adequate for Discharge   Problem: Pain Managment: Goal: General experience of comfort will  improve 04/03/2018 0906 by Esperanza RichtersAdamah, Aadarsh Cozort F, RN Outcome: Adequate for Discharge 04/03/2018 0906 by Esperanza RichtersAdamah, Randy Whitener F, RN Outcome: Progressing   Problem: Safety: Goal: Ability to remain free from injury will improve Outcome: Adequate for Discharge   Problem: Skin Integrity: Goal: Risk for impaired skin integrity will decrease 04/03/2018 0908 by Esperanza RichtersAdamah, Indigo Chaddock F, RN Outcome: Adequate for Discharge 04/03/2018 0906 by Esperanza RichtersAdamah, Joniah Bednarski F, RN Outcome: Progressing

## 2018-04-03 NOTE — Plan of Care (Signed)
  Problem: Education: °Goal: Knowledge of General Education information will improve °Description: Including pain rating scale, medication(s)/side effects and non-pharmacologic comfort measures °Outcome: Progressing °  °Problem: Health Behavior/Discharge Planning: °Goal: Ability to manage health-related needs will improve °Outcome: Progressing °  °Problem: Clinical Measurements: °Goal: Ability to maintain clinical measurements within normal limits will improve °Outcome: Progressing °Goal: Will remain free from infection °Outcome: Progressing °  °Problem: Coping: °Goal: Level of anxiety will decrease °Outcome: Progressing °  °Problem: Pain Managment: °Goal: General experience of comfort will improve °Outcome: Progressing °  °Problem: Skin Integrity: °Goal: Risk for impaired skin integrity will decrease °Outcome: Progressing °  °

## 2018-04-03 NOTE — Discharge Summary (Signed)
PATIENT DETAILS Name: Ann Berry Age: 33 y.o. Sex: female Date of Birth: 1985-12-28 MRN: [id number]. Admitting Physician: Shela Leff, MD [id number], No Pcp Per  Admit Date: 04/01/2018 Discharge date: 04/03/2018  Recommendations for Outpatient Follow-up:  1. Follow up with PCP in 1-2 weeks 2. Please obtain BMP/CBC in one week 3. Ensure follow-up with GYN.  Admitted From:  Home  Disposition: [place]: No  Equipment/Devices: None  Discharge Condition: Stable  CODE STATUS: FULL CODE  Diet recommendation:  Heart Healthy / Carb Modified  Brief Summary: See H&P, Labs, Consult and Test reports for all details in brief, Patient is a 33 y.o. female with prior history of insulin-dependent diabetes-noncompliant to medications-presenting to the hospital with 3-week history of vaginal bleeding, acute blood loss anemia and uncontrolled diabetes with hyperglycemia.  See below for further details  Brief Hospital Course: Acute blood loss anemia secondary to significant vaginal bleeding: Much improved-Per patient now only spotting this morning-she was treated with Megace and IV Premarin x4.  Transvaginal ultrasound did not show any major abnormalities.  Telephone consultation was done with GYN MD on call by this MD yesterday (Dr. Roland Rack) and also today (Dr. Gala Romney).  Recommendations are to continue with Megace on discharge.  Outpatient appointment with GYN has been made for February 6.  Patient was transfused a total of 3 units of PRBC-hemoglobin has improved to 7.9-patient will continue with iron supplementation on discharge.  Uncontrolled DM-1 with hyperglycemia: Initially required insulin drip-unfortunately has not been compliant with insulin therapy.  CBGs currently stable-will increase Lantus to 20 units, continue with SSI and metformin on discharge.  A1c was not checked as patient received PRBC transfusion-can be done by primary care practitioner in  the outpatient setting.  Extensive counseling regarding importance of compliance to medication was done by this MD.  Procedures/Studies: None  Discharge Diagnoses:  Principal Problem:   Abnormal uterine bleeding Active Problems:   Symptomatic anemia   Hyperglycemia   Vaginal bleeding   Discharge Instructions:  Activity:  As tolerated with Full fall precautions use walker/cane & assistance as needed   Discharge Instructions    Diet - low sodium heart healthy   Complete by:  As directed    Diet Carb Modified   Complete by:  As directed    Discharge instructions   Complete by:  As directed    Follow with Primary MD in 1 week  [facility] will call you with a follow-up appointment  Please take insulin as prescribed-please check your sugars 3 times a day, keep a record of these readings and take it to your next appointment with your primary care practitioner.  Please get a complete blood count and chemistry panel checked by your Primary MD at your next visit, and again as instructed by your Primary MD.  Get Medicines reviewed and adjusted: Please take all your medications with you for your next visit with your Primary MD  Laboratory/radiological data: Please request your Primary MD to go over all hospital tests and procedure/radiological results at the follow up, please ask your Primary MD to get all Hospital records sent to his/her office.  In some cases, they will be blood work, cultures and biopsy results pending at the time of your discharge. Please request that your primary care M.D. follows up on these results.  Also Note the following: If you experience worsening of your admission symptoms, develop shortness of breath, life threatening emergency, suicidal or  homicidal thoughts you must seek medical attention immediately by calling 911 or calling your MD immediately  if symptoms less severe.  You must read complete  instructions/literature along with all the possible adverse reactions/side effects for all the Medicines you take and that have been prescribed to you. Take any new Medicines after you have completely understood and accpet all the possible adverse reactions/side effects.   Do not drive when taking Pain medications or sleeping medications (Benzodaizepines)  Do not take more than prescribed Pain, Sleep and Anxiety Medications. It is not advisable to combine anxiety,sleep and pain medications without talking with your primary care practitioner  Special Instructions: If you have smoked or chewed Tobacco  in the last 2 yrs please stop smoking, stop any regular Alcohol  and or any Recreational drug use.  Wear Seat belts while driving.  Please note: You were cared for by a hospitalist during your hospital stay. Once you are discharged, your primary care physician will handle any further medical issues. Please note that NO REFILLS for any discharge medications will be authorized once you are discharged, as it is imperative that you return to your primary care physician (or establish a relationship with a primary care physician if you do not have one) for your post hospital discharge needs so that they can reassess your need for medications and monitor your lab values.   Increase activity slowly   Complete by:  As directed      Allergies as of 04/03/2018   No Known Allergies     Medication List    STOP taking these medications   ibuprofen 200 MG tablet Commonly known as:  ADVIL,MOTRIN     TAKE these medications   blood glucose meter kit and supplies Dispense based on patient and insurance preference. Use up to four times daily as directed. (FOR ICD-10 E10.9, E11.9).   ferrous sulfate 325 (65 FE) MG tablet Take 1 tablet (325 mg total) by mouth 2 (two) times daily with a meal.   freestyle lancets Use as instructed   glucose blood test strip Commonly known as:  FREESTYLE TEST STRIPS Use as  instructed   insulin aspart 100 UNIT/ML FlexPen Commonly known as:  NOVOLOG FLEXPEN 0-15 Units, Subcutaneous, 3 times daily with meals CBG < 70: implement hypoglycemia protocol-call MD CBG 70 - 120: 0 units CBG 121 - 150: 2 units CBG 151 - 200: 3 units CBG 201 - 250: 5 units CBG 251 - 300: 8 units CBG 301 - 350: 11 units CBG 351 - 400: 15 units CBG > 400: call MD   Insulin Glargine 100 UNIT/ML Solostar Pen Commonly known as:  LANTUS Inject 20 Units into the skin daily.   Insulin Pen Needle 32G X 8 MM Misc Use as directed   megestrol 40 MG tablet Commonly known as:  MEGACE Take 1 tablet (40 mg total) by mouth 3 (three) times daily.   metFORMIN 500 MG 24 hr tablet Commonly known as:  GLUCOPHAGE-XR Take 1 tablet (500 mg total) by mouth 2 (two) times daily.      Follow-up Information    Primary care MD. Schedule an appointment as soon as possible for a visit in 1 week(s).        [place]. Schedule an appointment as soon as possible for a visit on 04/09/2018.   Why:  appt at [place] officeat 8:15 am Contact information: [place] [phone]  No Known Allergies  Consultations:   Telephone consultation with GYN.  Other Procedures/Studies: US Pelvic Complete With Transvaginal  Result Date: 04/02/2018 CLINICAL DATA:  Heavy vaginal bleeding EXAM: TRANSABDOMINAL AND TRANSVAGINAL ULTRASOUND OF PELVIS TECHNIQUE: Both transabdominal and transvaginal ultrasound examinations of the pelvis were performed. Transabdominal technique was performed for global imaging of the pelvis including uterus, ovaries, adnexal regions, and pelvic cul-de-sac. It was necessary to proceed with endovaginal exam following the transabdominal exam to visualize the uterus endometrium and ovaries. COMPARISON:  Pelvic ultrasound 08/23/2017 FINDINGS: Uterus Measurements: 9.3 x 4.1 x 5.2 cm = volume: 106.8 mL. Heterogenous material within the lower  uterine segment and cervix measuring 4 x 4 cm. Endometrium Thickness: 2.1 mm.  No focal abnormality visualized. Right ovary Measurements: 5.1 x 2.7 x 3.1 cm = volume: 23.4 mL. Cyst measuring 2.7 x 2 cm Left ovary Measurements: 2.1 x 1.7 x 1.3 cm = volume: 2.6 mL. Normal appearance/no adnexal mass. Other findings No abnormal free fluid. IMPRESSION: Heterogenous appearance to the lower uterine segment and cervix. Unclear if this represents hemorrhagic material/blood products versus ill-defined lower uterine segment/cervical mass. No other significant abnormalities are seen. Endometrial thickness is normal at 2.1 mm Electronically Signed   By: Donavan Foil M.D.   On: 04/02/2018 03:27     TODAY-DAY OF DISCHARGE:  Subjective:   Darrielle Pflieger today has no headache,no chest abdominal pain,no new weakness tingling or numbness, feels much better wants to go home today.   Objective:   Blood pressure 128/82, pulse 85, temperature 98.4 F (36.9 C), temperature source Oral, resp. rate 14, height '5\' 11"'$  (1.803 m), weight 73.5 kg, last menstrual period 04/01/2018, SpO2 100 %.  Intake/Output Summary (Last 24 hours) at 04/03/2018 1220 Last data filed at 04/03/2018 0930 Gross per 24 hour  Intake 1356.75 ml  Output -  Net 1356.75 ml   Filed Weights   04/01/18 1836  Weight: 73.5 kg    Exam: Awake Alert, Oriented *3, No new F.N deficits, Normal affect [place].AT,PERRAL Supple Neck,No JVD, No cervical lymphadenopathy appriciated.  Symmetrical Chest wall movement, Good air movement bilaterally, CTAB RRR,No Gallops,Rubs or new Murmurs, No Parasternal Heave +ve B.Sounds, Abd Soft, Non tender, No organomegaly appriciated, No rebound -guarding or rigidity. No Cyanosis, Clubbing or edema, No new Rash or bruise   PERTINENT RADIOLOGIC STUDIES: US Pelvic Complete With Transvaginal  Result Date: 04/02/2018 CLINICAL DATA:  Heavy vaginal bleeding EXAM: TRANSABDOMINAL AND TRANSVAGINAL ULTRASOUND OF PELVIS TECHNIQUE:  Both transabdominal and transvaginal ultrasound examinations of the pelvis were performed. Transabdominal technique was performed for global imaging of the pelvis including uterus, ovaries, adnexal regions, and pelvic cul-de-sac. It was necessary to proceed with endovaginal exam following the transabdominal exam to visualize the uterus endometrium and ovaries. COMPARISON:  Pelvic ultrasound 08/23/2017 FINDINGS: Uterus Measurements: 9.3 x 4.1 x 5.2 cm = volume: 106.8 mL. Heterogenous material within the lower uterine segment and cervix measuring 4 x 4 cm. Endometrium Thickness: 2.1 mm.  No focal abnormality visualized. Right ovary Measurements: 5.1 x 2.7 x 3.1 cm = volume: 23.4 mL. Cyst measuring 2.7 x 2 cm Left ovary Measurements: 2.1 x 1.7 x 1.3 cm = volume: 2.6 mL. Normal appearance/no adnexal mass. Other findings No abnormal free fluid. IMPRESSION: Heterogenous appearance to the lower uterine segment and cervix. Unclear if this represents hemorrhagic material/blood products versus ill-defined lower uterine segment/cervical mass. No other significant abnormalities are seen. Endometrial thickness is normal at 2.1 mm Electronically Signed   By: Madie Reno.D.  On: 04/02/2018 03:27     PERTINENT LAB RESULTS: CBC: Recent Labs    04/02/18 1705 04/03/18 0445  WBC 13.0* 11.1*  HGB 7.1* 7.9*  HCT 21.6* 24.0*  PLT 307 324   CMET CMP     Component Value Date/Time   NA 135 04/03/2018 0445   K 3.6 04/03/2018 0445   CL 105 04/03/2018 0445   CO2 25 04/03/2018 0445   GLUCOSE 311 (H) 04/03/2018 0445   BUN 9 04/03/2018 0445   CREATININE 0.77 04/03/2018 0445   CALCIUM 7.6 (L) 04/03/2018 0445   PROT 6.8 12/11/2009 0553   ALBUMIN 2.5 (L) 12/11/2009 0553   AST 20 12/11/2009 0553   ALT 12 12/11/2009 0553   ALKPHOS 93 12/11/2009 0553   BILITOT 0.9 12/11/2009 0553   GFRNONAA >60 04/03/2018 0445   GFRAA >60 04/03/2018 0445    GFR Estimated Creatinine Clearance: 113.9 mL/min (by C-G formula based  on SCr of 0.77 mg/dL). No results for input(s): LIPASE, AMYLASE in the last 72 hours. No results for input(s): CKTOTAL, CKMB, CKMBINDEX, TROPONINI in the last 72 hours. Invalid input(s): POCBNP No results for input(s): DDIMER in the last 72 hours. No results for input(s): HGBA1C in the last 72 hours. No results for input(s): CHOL, HDL, LDLCALC, TRIG, CHOLHDL, LDLDIRECT in the last 72 hours. No results for input(s): TSH, T4TOTAL, T3FREE, THYROIDAB in the last 72 hours.  Invalid input(s): FREET3 Recent Labs    04/02/18 1210  FERRITIN 16  TIBC 293  IRON 38   Coags: Recent Labs    04/02/18 0023  INR 1.08   Microbiology: Recent Results (from the past 240 hour(s))  MRSA PCR Screening     Status: None   Collection Time: 04/01/18 11:51 PM  Result Value Ref Range Status   MRSA by PCR NEGATIVE NEGATIVE Final    Comment:        The GeneXpert MRSA Assay (FDA approved for NASAL specimens only), is one component of a comprehensive MRSA colonization surveillance program. It is not intended to diagnose MRSA infection nor to guide or monitor treatment for MRSA infections. Performed at [facility], [place] [address], [place], [place] [id number]     FURTHER DISCHARGE INSTRUCTIONS:  Get Medicines reviewed and adjusted: Please take all your medications with you for your next visit with your Primary MD  Laboratory/radiological data: Please request your Primary MD to go over all hospital tests and procedure/radiological results at the follow up, please ask your Primary MD to get all Hospital records sent to his/her office.  In some cases, they will be blood work, cultures and biopsy results pending at the time of your discharge. Please request that your primary care M.D. goes through all the records of your hospital data and follows up on these results.  Also Note the following: If you experience worsening of your admission symptoms, develop shortness of breath, life threatening  emergency, suicidal or homicidal thoughts you must seek medical attention immediately by calling 911 or calling your MD immediately  if symptoms less severe.  You must read complete instructions/literature along with all the possible adverse reactions/side effects for all the Medicines you take and that have been prescribed to you. Take any new Medicines after you have completely understood and accpet all the possible adverse reactions/side effects.   Do not drive when taking Pain medications or sleeping medications (Benzodaizepines)  Do not take more than prescribed Pain, Sleep and Anxiety Medications. It is not advisable to combine anxiety,sleep  and pain medications without talking with your primary care practitioner  Special Instructions: If you have smoked or chewed Tobacco  in the last 2 yrs please stop smoking, stop any regular Alcohol  and or any Recreational drug use.  Wear Seat belts while driving.  Please note: You were cared for by a hospitalist during your hospital stay. Once you are discharged, your primary care physician will handle any further medical issues. Please note that NO REFILLS for any discharge medications will be authorized once you are discharged, as it is imperative that you return to your primary care physician (or establish a relationship with a primary care physician if you do not have one) for your post hospital discharge needs so that they can reassess your need for medications and monitor your lab values.  Total Time spent coordinating discharge including counseling, education and face to face time equals 35 minutes.  SignedOren Binet 04/03/2018 12:20 PM

## 2018-04-03 NOTE — Progress Notes (Signed)
Inpatient Diabetes Program Recommendations  AACE/ADA: New Consensus Statement on Inpatient Glycemic Control (2015)  Target Ranges:  Prepandial:   less than 140 mg/dL      Peak postprandial:   less than 180 mg/dL (1-2 hours)      Critically ill patients:  140 - 180 mg/dL   Lab Results  Component Value Date   GLUCAP 285 (H) 04/03/2018   HGBA1C (H) 12/05/2009    12.6 (NOTE)                                                                       According to the ADA Clinical Practice Recommendations for 2011, when HbA1c is used as a screening test:   >=6.5%   Diagnostic of Diabetes Mellitus           (if abnormal result  is confirmed)  5.7-6.4%   Increased risk of developing Diabetes Mellitus  References:Diagnosis and Classification of Diabetes Mellitus,Diabetes Care,2011,34(Suppl 1):S62-S69 and Standards of Medical Care in         Diabetes - 2011,Diabetes Care,2011,34  (Suppl 1):S11-S61.    Review of Glycemic Control  Diabetes history: Type 1 Outpatient Diabetes medications: Levemir 45 units daily Current orders for Inpatient glycemic control: Lantus 15 units daily, Novolog SENSITIVE correction scale TID & HS  Inpatient Diabetes Program Recommendations:   Spoke with patient at bedside. Patient states that she was diagnosed with diabetes 14 years ago (at 33 years old) and has been on Levemir for all that time. Was on Metformin for about 1 week, but was discontinued.  [facility], but has not been seen recently. States that she takes Levemir 45 units daily at home, either in the morning or at night depending on what she eats during the day. States that she does not eat breakfast and eats lunch, snacks, and dinner the rest of the day. States that she does not check blood sugars at home like she should. Inquired about the Freestyle Libre CGM, but it is not covered by Medicaid. States that her blood sugars usually run in the 200's when she does check them. Does  not have low blood sugars at home.   Suggested that patient follow up with physician since being in the hospital. She thought that perhaps taking short-acting insulin with meals might help her blood sugars, but her eating is not consistent. States that her HgbA1C has been 10-12% in the past.  Will continue to monitor blood sugars while in the hospital.  Smith MinceKendra Sheron Tallman RN BSN CDE Diabetes Coordinator Pager: [phone]  8am-5pm

## 2018-04-07 NOTE — Progress Notes (Signed)
Transitions of Care Follow Up Call Note  Ann Berry is an 33 y.o. female who presented to [facility] on 04/01/2018.  The patient had the following prescriptions filled at [facility]: metformin, megestrol, iron, Lantus, Novolog   Patient was called by pharmacist and HIPAA identifiers were verified. The following questions were asked about the prescriptions filled at [facility]:  Has the patient been experiencing any side effects to the medications prescribed? no Understanding of regimen: good Understanding of indications: good Potential of compliance: good  Pharmacist comments: Patient expresses that she is doing very well since discharge. She does endorse some shortness of breath when she walks up stairs and feels like she is retaining fluid. Patient denies use of home scale or weight gain. Patient follows up Thursday with PCP. Patient encouraged to keep follow up and call PCP office if symptoms worsen for evaluation soon. Also cautioned when to seek emergency care.    [x]  Patient's prescriptions with refills  filled at the [facility] were transferred to the following pharmacy: [facility] [address] in [facility] []  Patient unable to be reached after calling three times and prescriptions filled at the [facility] were transferred to preferred pharmacy found within their chart.   Donnamarie Shankles K Garmon Dehn 04/07/2018, 5:11 PM Transitions of Care Pharmacy Hours: Monday - Friday 8:30am to 5:00 PM  Phone - [phone]

## 2018-04-09 ENCOUNTER — Encounter: Payer: Medicaid Other | Admitting: Family Medicine

## 2020-11-11 IMAGING — US US PELVIS COMPLETE WITH TRANSVAGINAL
1 series · 13 of 25 positions shown · non-contrast
Comparison: Pelvic ultrasound 08/23/2017

CLINICAL DATA: Heavy vaginal bleeding

EXAM:
TRANSABDOMINAL AND TRANSVAGINAL ULTRASOUND OF PELVIS
TECHNIQUE: Both transabdominal and transvaginal ultrasound examinations of the
pelvis were performed. Transabdominal technique was performed for
global imaging of the pelvis including uterus, ovaries, adnexal
regions, and pelvic cul-de-sac. It was necessary to proceed with
endovaginal exam following the transabdominal exam to visualize the
uterus endometrium and ovaries.

[Series 1: us pelvis complete with transvaginal · 63 acquisitions, 13 frames shown]
[im 1/63]
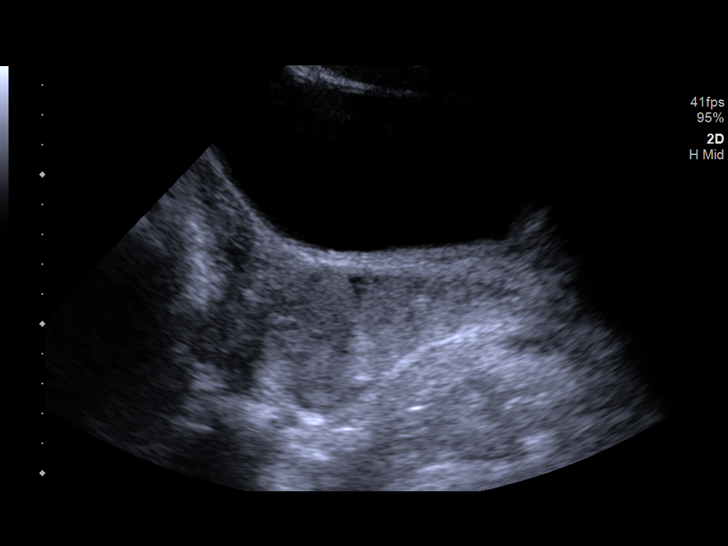
[im 6/63]
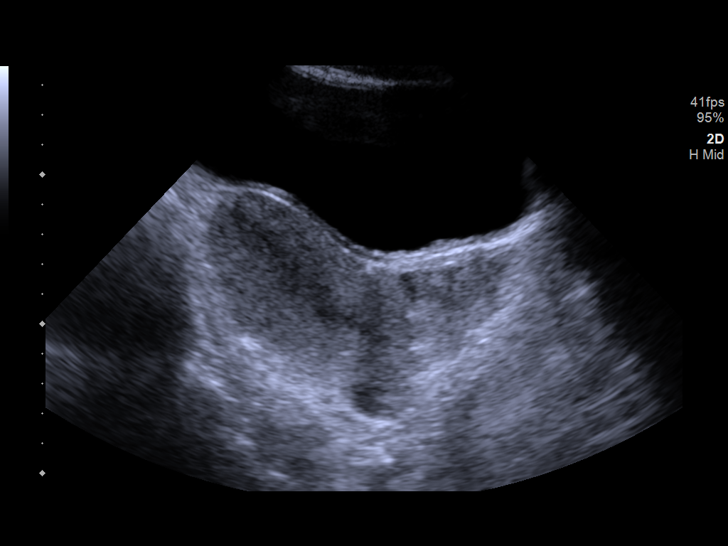
[im 11/63]
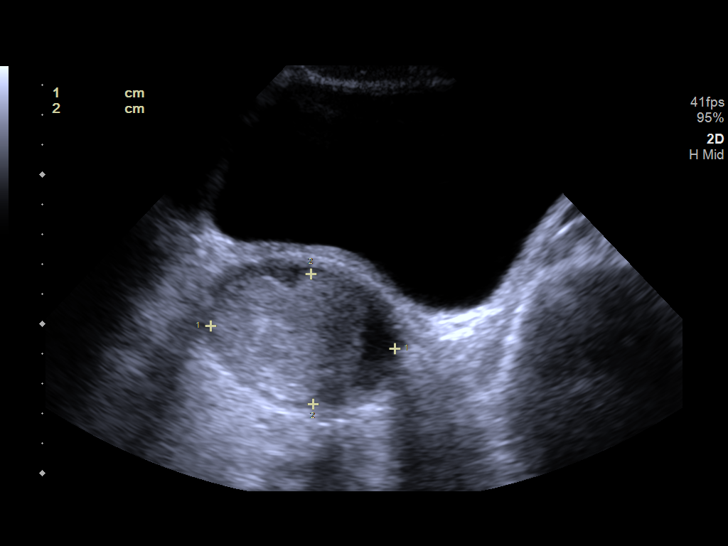
[im 16/63]
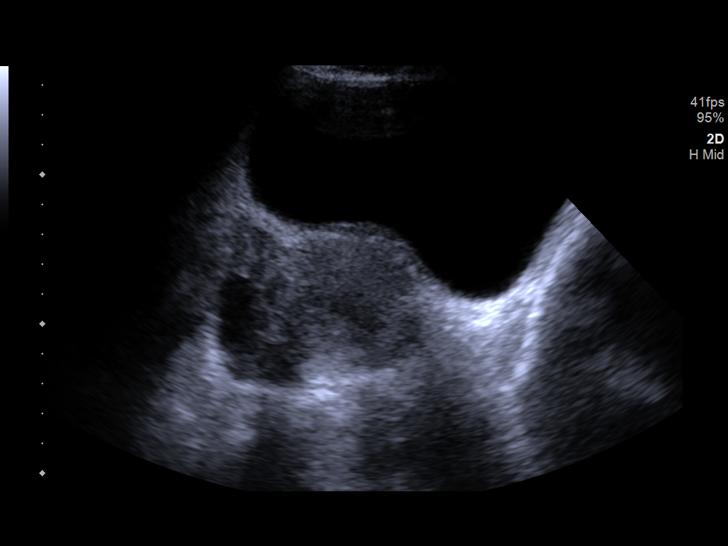
[im 21/63]
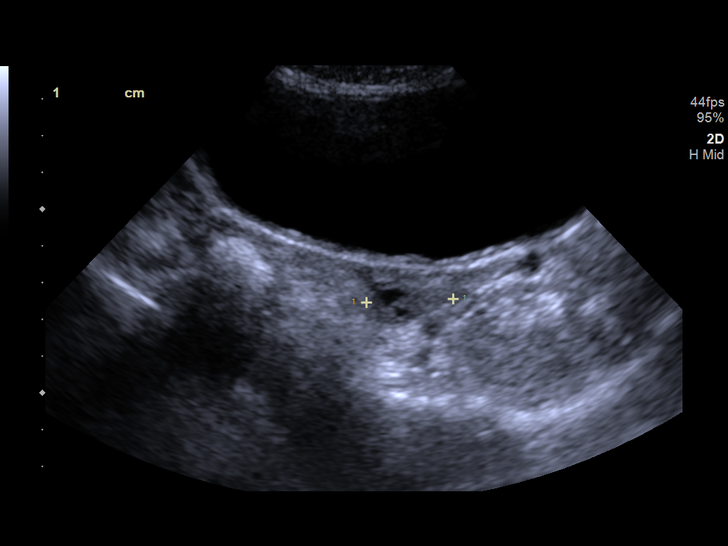
[im 26/63]
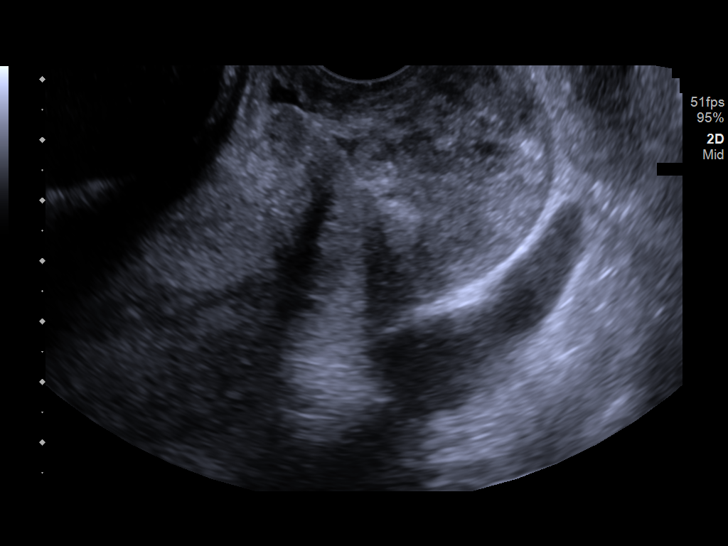
[im 32/63]
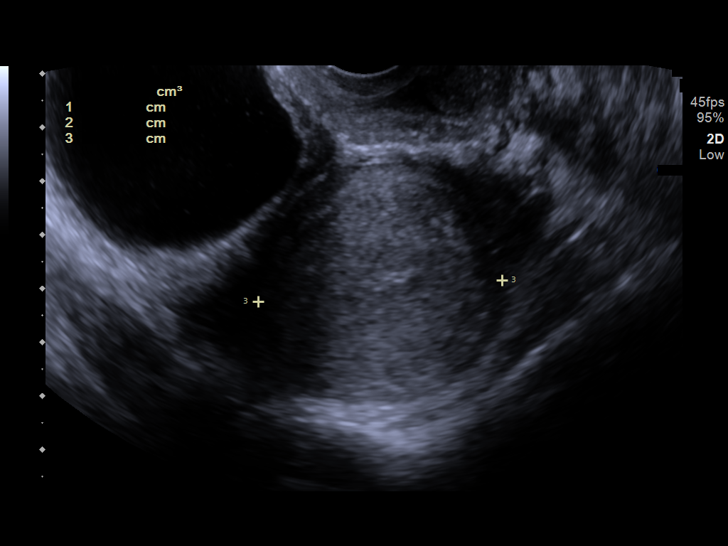
[im 37/63]
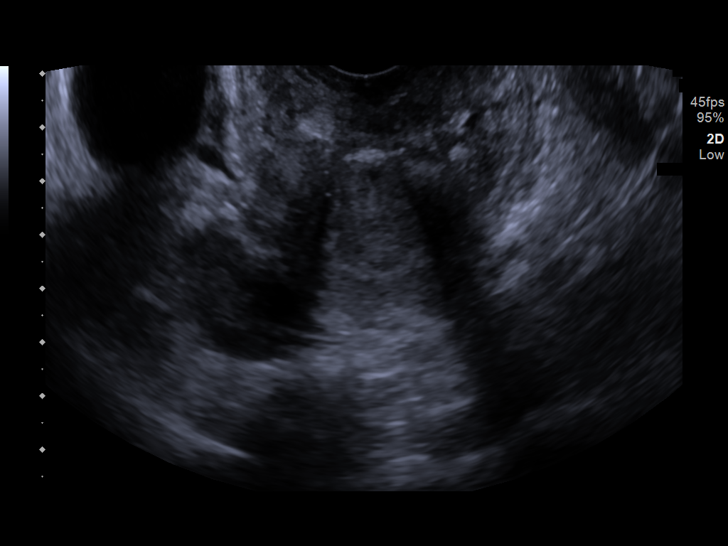
[im 42/63]
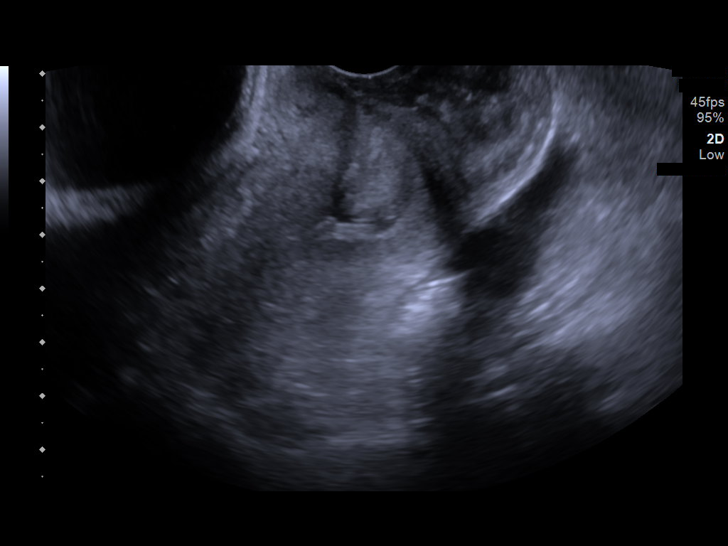
[im 47/63]
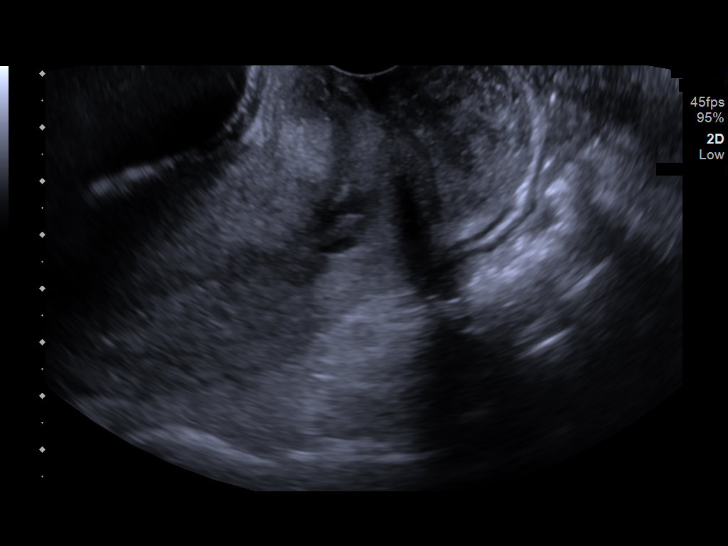
[im 52/63]
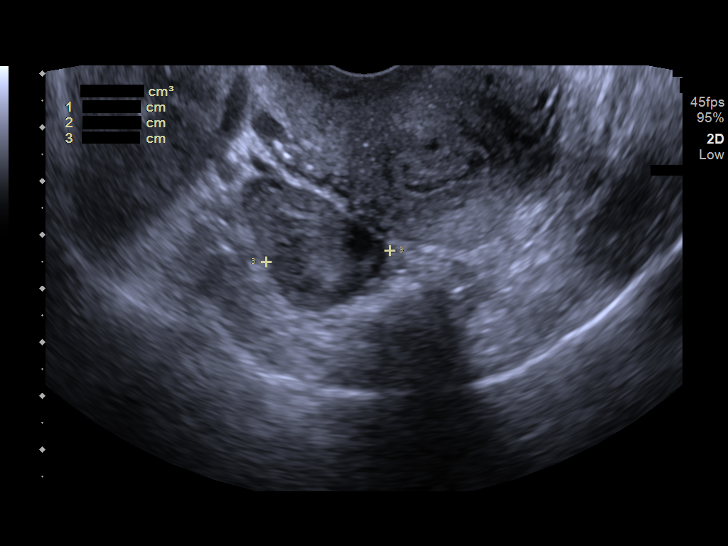
[im 57/63]
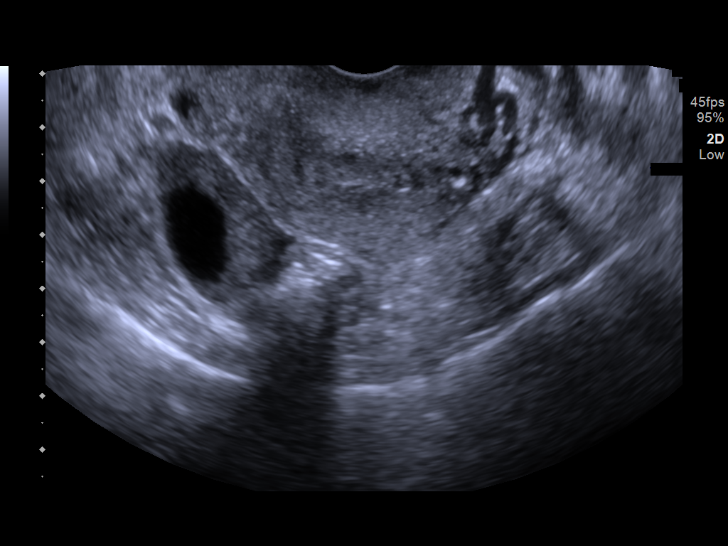
[im 63/63]
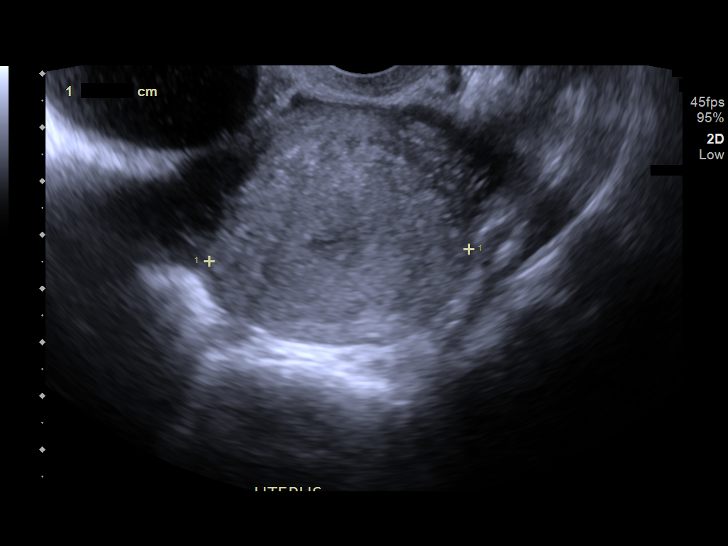

[13 of 25 positions shown; findings below may reference images not displayed]

FINDINGS: Uterus

Measurements: 9.3 x 4.1 x 5.2 cm = volume: 106.8 mL. Heterogenous
material within the lower uterine segment and cervix measuring 4 x 4
cm.

Endometrium

Thickness: 2.1 mm.  No focal abnormality visualized.

Right ovary

Measurements: 5.1 x 2.7 x 3.1 cm = volume: 23.4 mL. Cyst measuring
2.7 x 2 cm

Left ovary

Measurements: 2.1 x 1.7 x 1.3 cm = volume: 2.6 mL. Normal
appearance/no adnexal mass.

Other findings

No abnormal free fluid.
IMPRESSION: Heterogenous appearance to the lower uterine segment and cervix.
Unclear if this represents hemorrhagic material/blood products
versus ill-defined lower uterine segment/cervical mass. No other
significant abnormalities are seen. Endometrial thickness is normal
at 2.1 mm

## 2022-04-08 ENCOUNTER — Telehealth (HOSPITAL_COMMUNITY): Payer: Self-pay | Admitting: Vascular Surgery

## 2022-04-08 NOTE — Telephone Encounter (Signed)
Lvm giving pt fnew chf appt w/ db 3/15 @ 240 asked pt to cll back to confirm appt

## 2022-05-03 DIAGNOSIS — N186 End stage renal disease: Secondary | ICD-10-CM | POA: Diagnosis not present

## 2022-05-03 DIAGNOSIS — I10 Essential (primary) hypertension: Secondary | ICD-10-CM | POA: Diagnosis not present

## 2022-05-03 DIAGNOSIS — D631 Anemia in chronic kidney disease: Secondary | ICD-10-CM | POA: Diagnosis not present

## 2022-05-04 DIAGNOSIS — N2581 Secondary hyperparathyroidism of renal origin: Secondary | ICD-10-CM | POA: Diagnosis not present

## 2022-05-04 DIAGNOSIS — E43 Unspecified severe protein-calorie malnutrition: Secondary | ICD-10-CM | POA: Diagnosis not present

## 2022-05-04 DIAGNOSIS — D631 Anemia in chronic kidney disease: Secondary | ICD-10-CM | POA: Diagnosis not present

## 2022-05-04 DIAGNOSIS — N186 End stage renal disease: Secondary | ICD-10-CM | POA: Diagnosis not present

## 2022-05-06 DIAGNOSIS — N186 End stage renal disease: Secondary | ICD-10-CM | POA: Diagnosis not present

## 2022-05-06 DIAGNOSIS — D631 Anemia in chronic kidney disease: Secondary | ICD-10-CM | POA: Diagnosis not present

## 2022-05-06 DIAGNOSIS — N2581 Secondary hyperparathyroidism of renal origin: Secondary | ICD-10-CM | POA: Diagnosis not present

## 2022-05-07 DIAGNOSIS — N186 End stage renal disease: Secondary | ICD-10-CM | POA: Diagnosis not present

## 2022-05-07 DIAGNOSIS — E43 Unspecified severe protein-calorie malnutrition: Secondary | ICD-10-CM | POA: Diagnosis not present

## 2022-05-09 DIAGNOSIS — E1022 Type 1 diabetes mellitus with diabetic chronic kidney disease: Secondary | ICD-10-CM | POA: Diagnosis not present

## 2022-05-09 DIAGNOSIS — N186 End stage renal disease: Secondary | ICD-10-CM | POA: Diagnosis not present

## 2022-05-09 DIAGNOSIS — N2581 Secondary hyperparathyroidism of renal origin: Secondary | ICD-10-CM | POA: Diagnosis not present

## 2022-05-09 DIAGNOSIS — E43 Unspecified severe protein-calorie malnutrition: Secondary | ICD-10-CM | POA: Diagnosis not present

## 2022-05-09 DIAGNOSIS — D631 Anemia in chronic kidney disease: Secondary | ICD-10-CM | POA: Diagnosis not present

## 2022-05-09 DIAGNOSIS — D509 Iron deficiency anemia, unspecified: Secondary | ICD-10-CM | POA: Diagnosis not present

## 2022-05-11 DIAGNOSIS — N186 End stage renal disease: Secondary | ICD-10-CM | POA: Diagnosis not present

## 2022-05-11 DIAGNOSIS — D631 Anemia in chronic kidney disease: Secondary | ICD-10-CM | POA: Diagnosis not present

## 2022-05-11 DIAGNOSIS — N2581 Secondary hyperparathyroidism of renal origin: Secondary | ICD-10-CM | POA: Diagnosis not present

## 2022-05-11 DIAGNOSIS — E43 Unspecified severe protein-calorie malnutrition: Secondary | ICD-10-CM | POA: Diagnosis not present

## 2022-05-13 DIAGNOSIS — D631 Anemia in chronic kidney disease: Secondary | ICD-10-CM | POA: Diagnosis not present

## 2022-05-13 DIAGNOSIS — N2581 Secondary hyperparathyroidism of renal origin: Secondary | ICD-10-CM | POA: Diagnosis not present

## 2022-05-13 DIAGNOSIS — N186 End stage renal disease: Secondary | ICD-10-CM | POA: Diagnosis not present

## 2022-05-14 DIAGNOSIS — E43 Unspecified severe protein-calorie malnutrition: Secondary | ICD-10-CM | POA: Diagnosis not present

## 2022-05-14 DIAGNOSIS — N186 End stage renal disease: Secondary | ICD-10-CM | POA: Diagnosis not present

## 2022-05-16 DIAGNOSIS — D631 Anemia in chronic kidney disease: Secondary | ICD-10-CM | POA: Diagnosis not present

## 2022-05-16 DIAGNOSIS — N2581 Secondary hyperparathyroidism of renal origin: Secondary | ICD-10-CM | POA: Diagnosis not present

## 2022-05-16 DIAGNOSIS — E43 Unspecified severe protein-calorie malnutrition: Secondary | ICD-10-CM | POA: Diagnosis not present

## 2022-05-16 DIAGNOSIS — N186 End stage renal disease: Secondary | ICD-10-CM | POA: Diagnosis not present

## 2022-05-17 ENCOUNTER — Encounter (HOSPITAL_COMMUNITY): Payer: Medicaid Other | Admitting: Internal Medicine

## 2022-05-18 DIAGNOSIS — N186 End stage renal disease: Secondary | ICD-10-CM | POA: Diagnosis not present

## 2022-05-18 DIAGNOSIS — N2581 Secondary hyperparathyroidism of renal origin: Secondary | ICD-10-CM | POA: Diagnosis not present

## 2022-05-18 DIAGNOSIS — D631 Anemia in chronic kidney disease: Secondary | ICD-10-CM | POA: Diagnosis not present

## 2022-05-18 DIAGNOSIS — E43 Unspecified severe protein-calorie malnutrition: Secondary | ICD-10-CM | POA: Diagnosis not present

## 2022-05-21 DIAGNOSIS — E43 Unspecified severe protein-calorie malnutrition: Secondary | ICD-10-CM | POA: Diagnosis not present

## 2022-05-21 DIAGNOSIS — N186 End stage renal disease: Secondary | ICD-10-CM | POA: Diagnosis not present

## 2022-05-21 DIAGNOSIS — N2581 Secondary hyperparathyroidism of renal origin: Secondary | ICD-10-CM | POA: Diagnosis not present

## 2022-05-21 DIAGNOSIS — D631 Anemia in chronic kidney disease: Secondary | ICD-10-CM | POA: Diagnosis not present

## 2022-05-23 DIAGNOSIS — N2581 Secondary hyperparathyroidism of renal origin: Secondary | ICD-10-CM | POA: Diagnosis not present

## 2022-05-23 DIAGNOSIS — E43 Unspecified severe protein-calorie malnutrition: Secondary | ICD-10-CM | POA: Diagnosis not present

## 2022-05-23 DIAGNOSIS — D631 Anemia in chronic kidney disease: Secondary | ICD-10-CM | POA: Diagnosis not present

## 2022-05-23 DIAGNOSIS — N186 End stage renal disease: Secondary | ICD-10-CM | POA: Diagnosis not present

## 2022-05-25 DIAGNOSIS — D509 Iron deficiency anemia, unspecified: Secondary | ICD-10-CM | POA: Diagnosis not present

## 2022-05-25 DIAGNOSIS — D631 Anemia in chronic kidney disease: Secondary | ICD-10-CM | POA: Diagnosis not present

## 2022-05-25 DIAGNOSIS — E11319 Type 2 diabetes mellitus with unspecified diabetic retinopathy without macular edema: Secondary | ICD-10-CM | POA: Diagnosis not present

## 2022-05-25 DIAGNOSIS — J811 Chronic pulmonary edema: Secondary | ICD-10-CM | POA: Diagnosis not present

## 2022-05-25 DIAGNOSIS — I161 Hypertensive emergency: Secondary | ICD-10-CM | POA: Diagnosis not present

## 2022-05-25 DIAGNOSIS — I509 Heart failure, unspecified: Secondary | ICD-10-CM | POA: Diagnosis not present

## 2022-05-25 DIAGNOSIS — N189 Chronic kidney disease, unspecified: Secondary | ICD-10-CM | POA: Diagnosis not present

## 2022-05-25 DIAGNOSIS — N186 End stage renal disease: Secondary | ICD-10-CM | POA: Diagnosis not present

## 2022-05-25 DIAGNOSIS — Z992 Dependence on renal dialysis: Secondary | ICD-10-CM | POA: Diagnosis not present

## 2022-05-25 DIAGNOSIS — J810 Acute pulmonary edema: Secondary | ICD-10-CM | POA: Diagnosis not present

## 2022-05-25 DIAGNOSIS — I132 Hypertensive heart and chronic kidney disease with heart failure and with stage 5 chronic kidney disease, or end stage renal disease: Secondary | ICD-10-CM | POA: Diagnosis not present

## 2022-05-25 DIAGNOSIS — I21A1 Myocardial infarction type 2: Secondary | ICD-10-CM | POA: Diagnosis not present

## 2022-05-25 DIAGNOSIS — E1122 Type 2 diabetes mellitus with diabetic chronic kidney disease: Secondary | ICD-10-CM | POA: Diagnosis not present

## 2022-05-25 DIAGNOSIS — I160 Hypertensive urgency: Secondary | ICD-10-CM | POA: Diagnosis not present

## 2022-05-25 DIAGNOSIS — E43 Unspecified severe protein-calorie malnutrition: Secondary | ICD-10-CM | POA: Diagnosis not present

## 2022-05-25 DIAGNOSIS — R079 Chest pain, unspecified: Secondary | ICD-10-CM | POA: Diagnosis not present

## 2022-05-25 DIAGNOSIS — N2581 Secondary hyperparathyroidism of renal origin: Secondary | ICD-10-CM | POA: Diagnosis not present

## 2022-05-25 DIAGNOSIS — Z794 Long term (current) use of insulin: Secondary | ICD-10-CM | POA: Diagnosis not present

## 2022-05-28 DIAGNOSIS — N186 End stage renal disease: Secondary | ICD-10-CM | POA: Diagnosis not present

## 2022-05-28 DIAGNOSIS — N2581 Secondary hyperparathyroidism of renal origin: Secondary | ICD-10-CM | POA: Diagnosis not present

## 2022-05-28 DIAGNOSIS — D631 Anemia in chronic kidney disease: Secondary | ICD-10-CM | POA: Diagnosis not present

## 2022-05-30 DIAGNOSIS — N2581 Secondary hyperparathyroidism of renal origin: Secondary | ICD-10-CM | POA: Diagnosis not present

## 2022-05-30 DIAGNOSIS — D631 Anemia in chronic kidney disease: Secondary | ICD-10-CM | POA: Diagnosis not present

## 2022-05-30 DIAGNOSIS — N186 End stage renal disease: Secondary | ICD-10-CM | POA: Diagnosis not present

## 2022-06-01 DIAGNOSIS — N186 End stage renal disease: Secondary | ICD-10-CM | POA: Diagnosis not present

## 2022-06-01 DIAGNOSIS — E43 Unspecified severe protein-calorie malnutrition: Secondary | ICD-10-CM | POA: Diagnosis not present

## 2022-06-01 DIAGNOSIS — N2581 Secondary hyperparathyroidism of renal origin: Secondary | ICD-10-CM | POA: Diagnosis not present

## 2022-06-01 DIAGNOSIS — D631 Anemia in chronic kidney disease: Secondary | ICD-10-CM | POA: Diagnosis not present

## 2022-06-04 DIAGNOSIS — E43 Unspecified severe protein-calorie malnutrition: Secondary | ICD-10-CM | POA: Diagnosis not present

## 2022-06-04 DIAGNOSIS — N186 End stage renal disease: Secondary | ICD-10-CM | POA: Diagnosis not present

## 2022-06-06 DIAGNOSIS — E43 Unspecified severe protein-calorie malnutrition: Secondary | ICD-10-CM | POA: Diagnosis not present

## 2022-06-06 DIAGNOSIS — N186 End stage renal disease: Secondary | ICD-10-CM | POA: Diagnosis not present
